# Patient Record
Sex: Male | Born: 1955
Health system: Southern US, Community
[De-identification: ages and names within clinical notes are randomized; demographics above are authoritative.]

## PROBLEM LIST (undated history)

## (undated) DIAGNOSIS — F419 Anxiety disorder, unspecified: Secondary | ICD-10-CM

## (undated) DIAGNOSIS — E785 Hyperlipidemia, unspecified: Secondary | ICD-10-CM

## (undated) DIAGNOSIS — K219 Gastro-esophageal reflux disease without esophagitis: Secondary | ICD-10-CM

## (undated) DIAGNOSIS — N210 Calculus in bladder: Secondary | ICD-10-CM

## (undated) HISTORY — DX: Hyperlipidemia, unspecified: E78.5

## (undated) HISTORY — DX: Anxiety disorder, unspecified: F41.9

## (undated) HISTORY — PX: PROSTATE SURGERY: SHX751

## (undated) HISTORY — DX: Gastro-esophageal reflux disease without esophagitis: K21.9

## (undated) HISTORY — PX: KNEE ARTHROSCOPY: SUR90

---

## 2005-09-21 ENCOUNTER — Ambulatory Visit: Payer: Self-pay | Admitting: Cardiology

## 2013-03-05 ENCOUNTER — Other Ambulatory Visit: Payer: Self-pay | Admitting: Family Medicine

## 2013-03-05 DIAGNOSIS — Z87891 Personal history of nicotine dependence: Secondary | ICD-10-CM

## 2013-03-31 ENCOUNTER — Other Ambulatory Visit: Payer: Self-pay

## 2015-02-22 MED FILL — PANTOPRAZOLE SOD DR 40 MG T: 40 | 90 days supply | Qty: 90 | Fill #0

## 2015-03-18 DIAGNOSIS — R972 Elevated prostate specific antigen [PSA]: Secondary | ICD-10-CM | POA: Diagnosis not present

## 2015-03-18 DIAGNOSIS — Z79899 Other long term (current) drug therapy: Secondary | ICD-10-CM | POA: Diagnosis not present

## 2015-03-18 DIAGNOSIS — E782 Mixed hyperlipidemia: Secondary | ICD-10-CM | POA: Diagnosis not present

## 2015-03-18 DIAGNOSIS — F338 Other recurrent depressive disorders: Secondary | ICD-10-CM | POA: Diagnosis not present

## 2015-03-18 DIAGNOSIS — R7301 Impaired fasting glucose: Secondary | ICD-10-CM | POA: Diagnosis not present

## 2015-03-18 DIAGNOSIS — K635 Polyp of colon: Secondary | ICD-10-CM | POA: Diagnosis not present

## 2015-03-18 DIAGNOSIS — K219 Gastro-esophageal reflux disease without esophagitis: Secondary | ICD-10-CM | POA: Diagnosis not present

## 2015-03-18 DIAGNOSIS — R0681 Apnea, not elsewhere classified: Secondary | ICD-10-CM | POA: Diagnosis not present

## 2015-04-01 MED FILL — ESCITALOPRAM 10 MG TABLET: 10 | 90 days supply | Qty: 90 | Fill #0

## 2015-05-06 DIAGNOSIS — R351 Nocturia: Secondary | ICD-10-CM | POA: Diagnosis not present

## 2015-05-06 DIAGNOSIS — Z0000 Encounter for general adult medical examination without abnormal findings: Secondary | ICD-10-CM | POA: Diagnosis not present

## 2015-05-06 DIAGNOSIS — R972 Elevated prostate specific antigen [PSA]: Secondary | ICD-10-CM | POA: Diagnosis not present

## 2015-05-06 DIAGNOSIS — N401 Enlarged prostate with lower urinary tract symptoms: Secondary | ICD-10-CM | POA: Diagnosis not present

## 2015-05-11 DIAGNOSIS — G4733 Obstructive sleep apnea (adult) (pediatric): Secondary | ICD-10-CM | POA: Diagnosis not present

## 2015-05-11 DIAGNOSIS — G2581 Restless legs syndrome: Secondary | ICD-10-CM | POA: Diagnosis not present

## 2015-05-30 ENCOUNTER — Ambulatory Visit (HOSPITAL_BASED_OUTPATIENT_CLINIC_OR_DEPARTMENT_OTHER): Payer: 59 | Attending: Internal Medicine | Admitting: Radiology

## 2015-05-30 VITALS — Ht 72.0 in | Wt 250.0 lb

## 2015-05-30 DIAGNOSIS — R0683 Snoring: Secondary | ICD-10-CM | POA: Insufficient documentation

## 2015-05-30 DIAGNOSIS — G4733 Obstructive sleep apnea (adult) (pediatric): Secondary | ICD-10-CM | POA: Insufficient documentation

## 2015-05-30 DIAGNOSIS — G4736 Sleep related hypoventilation in conditions classified elsewhere: Secondary | ICD-10-CM | POA: Diagnosis not present

## 2015-06-01 MED FILL — PANTOPRAZOLE SOD DR 40 MG T: 40 | 90 days supply | Qty: 90 | Fill #0

## 2015-06-05 DIAGNOSIS — G4733 Obstructive sleep apnea (adult) (pediatric): Secondary | ICD-10-CM | POA: Diagnosis not present

## 2015-06-05 DIAGNOSIS — R0683 Snoring: Secondary | ICD-10-CM | POA: Diagnosis not present

## 2015-06-05 NOTE — Progress Notes (Signed)
   Patient Name: George Love, George Love Date: 05/30/2015 Gender: Male D.O.B: March 15, 1955 Age (years): 59 Referring Provider: Carlena Sax Height (inches): 25 Interpreting Physician: Baird Lyons MD, ABSM Weight (lbs): 250 RPSGT: Jacolyn Reedy BMI: 34 MRN: [id number] Neck Size: 17.00 CLINICAL INFORMATION Sleep Study Type: Unattended Home Sleep Test   Indication for sleep study: Snoring, Witnessed Apneas   Epworth Sleepiness Score: 9 SLEEP STUDY TECHNIQUE A multi-channel overnight portable sleep study was performed. The channels recorded were: nasal airflow, thoracic respiratory movement, and oxygen saturation with a pulse oximetry. Snoring was also monitored.  MEDICATIONS Patient self administered medications include: none reported during study night.  SLEEP ARCHITECTURE Patient was studied for 292.2 minutes. The sleep efficiency was 81.5 % and the patient was supine for 32.5%. The arousal index was 0.2 per hour.  RESPIRATORY PARAMETERS The overall AHI was 118.5 per hour, with a central apnea index of 0.6 per hour. The oxygen nadir was 78% during sleep.  CARDIAC DATA Mean heart rate during sleep was 67.7 bpm.  IMPRESSIONS - Severe obstructive sleep apnea occurred during this study (AHI = 118.5/h). - No significant central sleep apnea occurred during this study (CAI = 0.6/h). - Severe oxygen desaturation was noted during this study (Min O2 = 78%). - Patient snored during 25 % of study  DIAGNOSIS - Obstructive Sleep Apnea (327.23 [G47.33 ICD-10]) - Nocturnal Hypoxemia (327.26 [G47.36 ICD-10])  RECOMMENDATIONS - Treatment with scores in this range usually begins with CPAP titration unless clinical circumstances suggest otherwise. - Avoid alcohol, sedatives and other CNS depressants that may worsen sleep apnea and disrupt normal sleep architecture. - Sleep hygiene should be reviewed to assess factors that may improve sleep quality. - Weight management and regular  exercise should be initiated or continued.  Deneise Lever Diplomate, American Board of Sleep Medicine  ELECTRONICALLY SIGNED ON:  06/05/2015, 7:28 PM [place] PH: [phone]   FX: [phone] [place]

## 2015-06-14 DIAGNOSIS — J029 Acute pharyngitis, unspecified: Secondary | ICD-10-CM | POA: Diagnosis not present

## 2015-06-27 DIAGNOSIS — G4733 Obstructive sleep apnea (adult) (pediatric): Secondary | ICD-10-CM | POA: Diagnosis not present

## 2015-06-29 MED FILL — ESCITALOPRAM 10 MG TABLET: 10 | 30 days supply | Qty: 30 | Fill #0

## 2015-07-28 DIAGNOSIS — G4733 Obstructive sleep apnea (adult) (pediatric): Secondary | ICD-10-CM | POA: Diagnosis not present

## 2015-08-17 MED FILL — PANTOPRAZOLE SOD DR 40 MG T: 40 | 90 days supply | Qty: 90 | Fill #0

## 2015-08-19 MED FILL — ESCITALOPRAM 10 MG TABLET: 10 | 90 days supply | Qty: 90 | Fill #0

## 2015-08-27 DIAGNOSIS — G4733 Obstructive sleep apnea (adult) (pediatric): Secondary | ICD-10-CM | POA: Diagnosis not present

## 2015-09-12 DIAGNOSIS — G4733 Obstructive sleep apnea (adult) (pediatric): Secondary | ICD-10-CM | POA: Diagnosis not present

## 2015-09-27 DIAGNOSIS — G4733 Obstructive sleep apnea (adult) (pediatric): Secondary | ICD-10-CM | POA: Diagnosis not present

## 2015-09-30 DIAGNOSIS — G4733 Obstructive sleep apnea (adult) (pediatric): Secondary | ICD-10-CM | POA: Diagnosis not present

## 2015-10-28 DIAGNOSIS — G4733 Obstructive sleep apnea (adult) (pediatric): Secondary | ICD-10-CM | POA: Diagnosis not present

## 2015-11-03 DIAGNOSIS — N401 Enlarged prostate with lower urinary tract symptoms: Secondary | ICD-10-CM | POA: Diagnosis not present

## 2015-11-27 DIAGNOSIS — G4733 Obstructive sleep apnea (adult) (pediatric): Secondary | ICD-10-CM | POA: Diagnosis not present

## 2015-11-30 MED FILL — PANTOPRAZOLE SOD DR 40 MG T: 40 | 90 days supply | Qty: 90 | Fill #1

## 2015-11-30 MED FILL — ESCITALOPRAM 10 MG TABLET: 10 | 90 days supply | Qty: 90 | Fill #1

## 2015-12-28 DIAGNOSIS — G4733 Obstructive sleep apnea (adult) (pediatric): Secondary | ICD-10-CM | POA: Diagnosis not present

## 2016-01-11 DIAGNOSIS — H52223 Regular astigmatism, bilateral: Secondary | ICD-10-CM | POA: Diagnosis not present

## 2016-01-11 DIAGNOSIS — H524 Presbyopia: Secondary | ICD-10-CM | POA: Diagnosis not present

## 2016-01-27 DIAGNOSIS — G4733 Obstructive sleep apnea (adult) (pediatric): Secondary | ICD-10-CM | POA: Diagnosis not present

## 2016-02-16 MED FILL — PANTOPRAZOLE SOD DR 40 MG T: 40 | 90 days supply | Qty: 90 | Fill #1

## 2016-02-16 MED FILL — ESCITALOPRAM 10 MG TABLET: 10 | 90 days supply | Qty: 90 | Fill #0

## 2016-04-19 DIAGNOSIS — L570 Actinic keratosis: Secondary | ICD-10-CM | POA: Diagnosis not present

## 2016-04-19 DIAGNOSIS — Z0000 Encounter for general adult medical examination without abnormal findings: Secondary | ICD-10-CM | POA: Diagnosis not present

## 2016-04-19 DIAGNOSIS — E782 Mixed hyperlipidemia: Secondary | ICD-10-CM | POA: Diagnosis not present

## 2016-04-19 DIAGNOSIS — R972 Elevated prostate specific antigen [PSA]: Secondary | ICD-10-CM | POA: Diagnosis not present

## 2016-04-19 DIAGNOSIS — K219 Gastro-esophageal reflux disease without esophagitis: Secondary | ICD-10-CM | POA: Diagnosis not present

## 2016-04-19 DIAGNOSIS — R7301 Impaired fasting glucose: Secondary | ICD-10-CM | POA: Diagnosis not present

## 2016-04-19 DIAGNOSIS — Z86010 Personal history of colonic polyps: Secondary | ICD-10-CM | POA: Diagnosis not present

## 2016-04-19 DIAGNOSIS — F338 Other recurrent depressive disorders: Secondary | ICD-10-CM | POA: Diagnosis not present

## 2016-05-21 DIAGNOSIS — J069 Acute upper respiratory infection, unspecified: Secondary | ICD-10-CM | POA: Diagnosis not present

## 2016-05-21 DIAGNOSIS — H10023 Other mucopurulent conjunctivitis, bilateral: Secondary | ICD-10-CM | POA: Diagnosis not present

## 2016-06-05 MED FILL — PANTOPRAZOLE SOD DR 40 MG T: 40 | 90 days supply | Qty: 90 | Fill #0

## 2016-06-05 MED FILL — ESCITALOPRAM 10 MG TABLET: 10 | 90 days supply | Qty: 90 | Fill #0

## 2016-08-20 MED FILL — ESCITALOPRAM 10 MG TABLET: 10 | 90 days supply | Qty: 90 | Fill #0

## 2016-08-20 MED FILL — PANTOPRAZOLE SOD DR 40 MG T: 40 | 90 days supply | Qty: 90 | Fill #0

## 2016-09-12 DIAGNOSIS — G4733 Obstructive sleep apnea (adult) (pediatric): Secondary | ICD-10-CM | POA: Diagnosis not present

## 2016-10-03 DIAGNOSIS — G4733 Obstructive sleep apnea (adult) (pediatric): Secondary | ICD-10-CM | POA: Diagnosis not present

## 2016-12-10 MED FILL — ESCITALOPRAM 10 MG TABLET: 10 | 90 days supply | Qty: 90 | Fill #0

## 2016-12-10 MED FILL — PANTOPRAZOLE SOD DR 40 MG T: 40 | 90 days supply | Qty: 90 | Fill #0

## 2017-01-16 DIAGNOSIS — H524 Presbyopia: Secondary | ICD-10-CM | POA: Diagnosis not present

## 2017-01-16 DIAGNOSIS — H52223 Regular astigmatism, bilateral: Secondary | ICD-10-CM | POA: Diagnosis not present

## 2017-02-14 DIAGNOSIS — R972 Elevated prostate specific antigen [PSA]: Secondary | ICD-10-CM | POA: Diagnosis not present

## 2017-02-14 DIAGNOSIS — R351 Nocturia: Secondary | ICD-10-CM | POA: Diagnosis not present

## 2017-03-04 MED FILL — ESCITALOPRAM 10 MG TABLET: 10 | 90 days supply | Qty: 90 | Fill #1

## 2017-03-04 MED FILL — PANTOPRAZOLE SOD DR 40 MG T: 40 | 90 days supply | Qty: 90 | Fill #1

## 2017-04-08 ENCOUNTER — Other Ambulatory Visit: Payer: Self-pay | Admitting: Urology

## 2017-04-08 DIAGNOSIS — R972 Elevated prostate specific antigen [PSA]: Secondary | ICD-10-CM

## 2017-04-21 LAB — HM COLONOSCOPY

## 2017-05-03 ENCOUNTER — Ambulatory Visit
Admission: RE | Admit: 2017-05-03 | Discharge: 2017-05-03 | Disposition: A | Discharge status: Home / Self Care | Payer: No Typology Code available for payment source | Source: Ambulatory Visit | Attending: Urology | Admitting: Urology

## 2017-05-03 DIAGNOSIS — R972 Elevated prostate specific antigen [PSA]: Secondary | ICD-10-CM

## 2017-05-03 MED ORDER — GADOBENATE DIMEGLUMINE 529 MG/ML IV SOLN
20.0000 mL | Freq: Once | INTRAVENOUS | Status: AC | PRN
  Administered 2017-05-03: 20 mL via INTRAVENOUS

## 2017-05-14 MED FILL — PEG-3350 SOLUTION: 420 | 1 days supply | Qty: 4000 | Fill #0

## 2017-06-14 MED FILL — ESCITALOPRAM 10 MG TABLET: 10 | 90 days supply | Qty: 90 | Fill #0

## 2017-06-14 MED FILL — PANTOPRAZOLE SOD DR 40 MG T: 40 | 90 days supply | Qty: 90 | Fill #0

## 2017-09-20 MED FILL — PANTOPRAZOLE SOD DR 40 MG T: 40 | 90 days supply | Qty: 90 | Fill #1

## 2017-09-20 MED FILL — ESCITALOPRAM 10 MG TABLET: 10 | 90 days supply | Qty: 90 | Fill #1

## 2017-10-18 MED FILL — levoFLOXacin 750 MG TABS: 750 | 1 days supply | Qty: 1 | Fill #0

## 2017-12-19 MED FILL — ESCITALOPRAM 10 MG TABLET: 10 | 90 days supply | Qty: 90 | Fill #2

## 2017-12-19 MED FILL — PANTOPRAZOLE SOD DR 40 MG T: 40 | 90 days supply | Qty: 90 | Fill #2

## 2018-02-20 MED FILL — AZITHROMYCIN 250 MG TABLET: 250 | 3 days supply | Qty: 6 | Fill #0

## 2018-02-20 MED FILL — ATOVAQUONE-PROGUANIL 250-10: 250-100 | 21 days supply | Qty: 21 | Fill #0

## 2018-03-04 DIAGNOSIS — G4733 Obstructive sleep apnea (adult) (pediatric): Secondary | ICD-10-CM | POA: Diagnosis not present

## 2018-03-26 DIAGNOSIS — H524 Presbyopia: Secondary | ICD-10-CM | POA: Diagnosis not present

## 2018-03-26 DIAGNOSIS — H52223 Regular astigmatism, bilateral: Secondary | ICD-10-CM | POA: Diagnosis not present

## 2018-03-31 MED FILL — ESCITALOPRAM 10 MG TABLET: 10 | 90 days supply | Qty: 90 | Fill #3

## 2018-03-31 MED FILL — PANTOPRAZOLE SOD DR 40 MG T: 40 | 90 days supply | Qty: 90 | Fill #3

## 2018-04-05 DIAGNOSIS — R509 Fever, unspecified: Secondary | ICD-10-CM | POA: Diagnosis not present

## 2018-04-05 DIAGNOSIS — J101 Influenza due to other identified influenza virus with other respiratory manifestations: Secondary | ICD-10-CM | POA: Diagnosis not present

## 2018-04-29 DIAGNOSIS — L570 Actinic keratosis: Secondary | ICD-10-CM | POA: Diagnosis not present

## 2018-04-29 MED FILL — FLUOROURACIL 5% CREAM: 5 | 20 days supply | Qty: 40 | Fill #0

## 2018-05-21 DIAGNOSIS — R972 Elevated prostate specific antigen [PSA]: Secondary | ICD-10-CM | POA: Diagnosis not present

## 2018-05-29 DIAGNOSIS — R972 Elevated prostate specific antigen [PSA]: Secondary | ICD-10-CM | POA: Diagnosis not present

## 2018-05-29 DIAGNOSIS — R351 Nocturia: Secondary | ICD-10-CM | POA: Diagnosis not present

## 2018-07-01 MED FILL — PANTOPRAZOLE SOD DR 40 MG T: 40 | 90 days supply | Qty: 90 | Fill #0

## 2018-07-01 MED FILL — ESCITALOPRAM 10 MG TABLET: 10 | 90 days supply | Qty: 90 | Fill #0

## 2018-10-08 MED FILL — PANTOPRAZOLE SOD DR 40 MG T: 40 | 90 days supply | Qty: 90 | Fill #0

## 2018-10-08 MED FILL — ESCITALOPRAM 10 MG TABLET: 10 | 90 days supply | Qty: 90 | Fill #0

## 2018-11-19 DIAGNOSIS — R972 Elevated prostate specific antigen [PSA]: Secondary | ICD-10-CM | POA: Diagnosis not present

## 2019-01-09 MED FILL — PANTOPRAZOLE SOD DR 40 MG T: 40 | 30 days supply | Qty: 30 | Fill #0

## 2019-01-09 MED FILL — ESCITALOPRAM 10 MG TABLET: 10 | 30 days supply | Qty: 30 | Fill #0

## 2019-01-26 ENCOUNTER — Other Ambulatory Visit: Payer: Self-pay

## 2019-01-26 DIAGNOSIS — Z20822 Contact with and (suspected) exposure to covid-19: Secondary | ICD-10-CM

## 2019-01-27 LAB — NOVEL CORONAVIRUS, NAA: SARS-CoV-2, NAA: NOT DETECTED

## 2019-02-11 DIAGNOSIS — E782 Mixed hyperlipidemia: Secondary | ICD-10-CM | POA: Diagnosis not present

## 2019-02-11 DIAGNOSIS — G4733 Obstructive sleep apnea (adult) (pediatric): Secondary | ICD-10-CM | POA: Diagnosis not present

## 2019-02-11 DIAGNOSIS — R7301 Impaired fasting glucose: Secondary | ICD-10-CM | POA: Diagnosis not present

## 2019-02-11 DIAGNOSIS — Z86010 Personal history of colonic polyps: Secondary | ICD-10-CM | POA: Diagnosis not present

## 2019-02-11 DIAGNOSIS — R972 Elevated prostate specific antigen [PSA]: Secondary | ICD-10-CM | POA: Diagnosis not present

## 2019-02-11 DIAGNOSIS — F338 Other recurrent depressive disorders: Secondary | ICD-10-CM | POA: Diagnosis not present

## 2019-02-11 DIAGNOSIS — Z79899 Other long term (current) drug therapy: Secondary | ICD-10-CM | POA: Diagnosis not present

## 2019-02-11 DIAGNOSIS — K219 Gastro-esophageal reflux disease without esophagitis: Secondary | ICD-10-CM | POA: Diagnosis not present

## 2019-02-11 MED FILL — ESCITALOPRAM 10 MG TABLET: 10 | 90 days supply | Qty: 90 | Fill #0

## 2019-02-11 MED FILL — PANTOPRAZOLE SOD DR 40 MG T: 40 | 90 days supply | Qty: 90 | Fill #0

## 2019-02-19 DIAGNOSIS — R7301 Impaired fasting glucose: Secondary | ICD-10-CM | POA: Diagnosis not present

## 2019-02-19 DIAGNOSIS — F338 Other recurrent depressive disorders: Secondary | ICD-10-CM | POA: Diagnosis not present

## 2019-02-19 DIAGNOSIS — Z79899 Other long term (current) drug therapy: Secondary | ICD-10-CM | POA: Diagnosis not present

## 2019-02-19 DIAGNOSIS — E782 Mixed hyperlipidemia: Secondary | ICD-10-CM | POA: Diagnosis not present

## 2019-03-24 ENCOUNTER — Other Ambulatory Visit: Payer: No Typology Code available for payment source

## 2019-03-25 ENCOUNTER — Ambulatory Visit: Payer: No Typology Code available for payment source | Attending: Internal Medicine

## 2019-03-25 DIAGNOSIS — Z20822 Contact with and (suspected) exposure to covid-19: Secondary | ICD-10-CM

## 2019-03-25 MED FILL — ESCITALOPRAM 10 MG TABLET: 10 | 90 days supply | Qty: 90 | Fill #0

## 2019-03-25 MED FILL — PANTOPRAZOLE SOD DR 40 MG T: 40 | 90 days supply | Qty: 90 | Fill #0

## 2019-03-26 LAB — NOVEL CORONAVIRUS, NAA: SARS-CoV-2, NAA: NOT DETECTED

## 2019-04-29 IMAGING — MR MR PROSTATE WO/W CM
56 series · 56 of 56 positions shown · IV contrast (Multihance 20cc)
Comparison: None.

ADDENDUM:
The original report was by Dr. Codi Maltez. The following
addendum is by Dr. Codi Maltez:

I have been informed by [HOSPITAL] [REDACTED] personnel that the targeting
data has fallen off of the server because the exam is so old, but
now there is a clinical request to have targeting data for a
prostate biopsy. I have been asked to reprocessed this case using
the DynaCAD software.
Given the interval 6 months since this scan was done, I can make no
assurance that the lesions are in the same place or that the
prostate has the same size and configuration as it did back in
[REDACTED]. Targeting data may well be off and can lead to mistargeting.
Nevertheless I do understand that this is a clinical request and so
I will reprocessed the data for targeting.
Targeting data has been sent to the DynaCAD for region of interest #
1 in the right peripheral zone. I was a little bit more generous
with extending this lesion up towards the base of the prostate
gland, resulting in an total volume of 1.4 cubic cm.
Targeting data has been sent to [employer] for region of interest # 2
in the left peripheral zone.
CLINICAL DATA: Elevated PSA level with polyuria. PSA level on
02/14/2017 of 8.4. Prior biopsy results from 10/22/2013 reveal
Gleason 3+3=6 prostate adenocarcinoma in small portions of cores
from the right lateral mid gland, right mid gland, right lateral
apex, and right apex.
EXAM:
MR PROSTATE WITHOUT AND WITH CONTRAST
TECHNIQUE: Multiplanar multisequence MRI images were obtained of the pelvis
centered about the prostate. Pre and post contrast images were
obtained.
CONTRAST:  20 cc MultiHance
Creatinine was obtained on site at [HOSPITAL] at [HOSPITAL].
Results: Creatinine 0.8 mg/dL.

[Series 3: T1 · axial · 8.0mm · 1.06mm/px · 1 of 32 slices shown (1 of 2)]
[im 1/32]
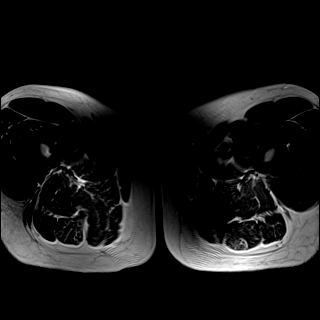

[Series 4: bSSFP fat-sat · axial · 8.0mm · 0.74mm/px · 1 of 32 slices shown]
[im 1/32]
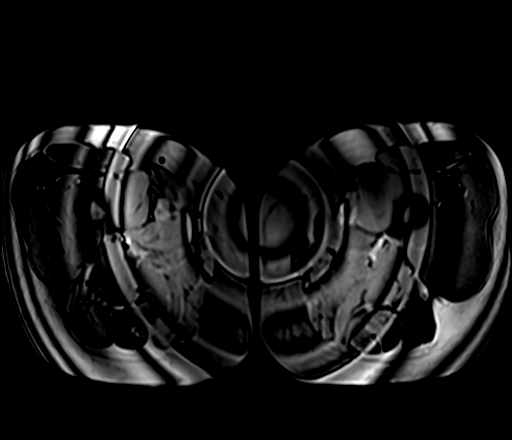

[Series 5: T2 · sagittal · 3.5mm · 0.59mm/px · 1 of 44 slices shown (1 of 4)]
[im 1/44]
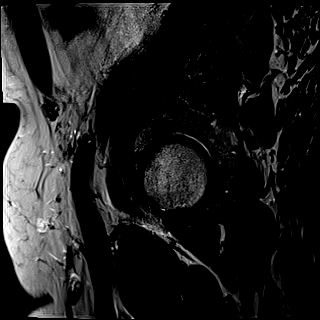

[Series 6: T1 · axial · 3.0mm · 0.33mm/px · 1 of 48 slices shown (2 of 2)]
[im 1/48]
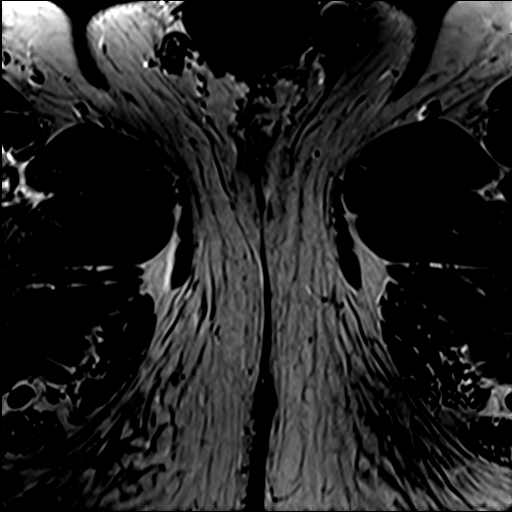

[Series 7: T2 · axial · 3.5mm · 0.56mm/px · 1 of 35 slices shown (2 of 4)]
[im 1/35]
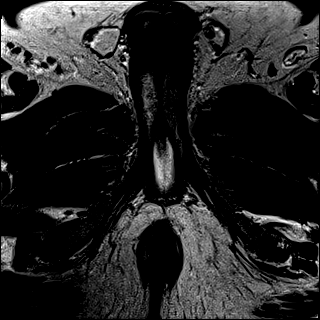

[Series 8: T2 · axial · 1.0mm · 1.04mm/px · 1 of 104 slices shown (3 of 4)]
[im 1/104]
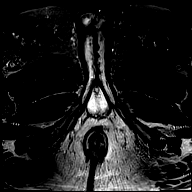

[Series 9: T2 · coronal · 3.5mm · 0.59mm/px · 1 of 43 slices shown (4 of 4)]
[im 1/43]
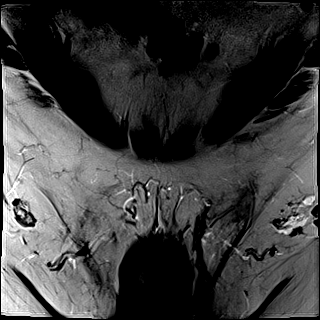

[Series 10: DWI · axial · 3.5mm · 1.56mm/px · 1 of 90 slices shown (1 of 2)]
[im 1/90]
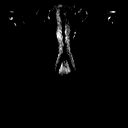

[Series 11: DWI · axial · 3.5mm · 1.56mm/px · 1 of 29 slices shown (2 of 2)]
[im 1/29]
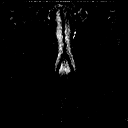

[Series 12: pre t1_twist_tra_dyn_ttc=7.5s · axial · non-contrast · 3.5mm · 0.89mm/px · 1 of 32 slices shown]
[im 1/32]
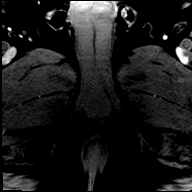

[Series 13: post t1_twist_tra_dyn-copy center · axial · 3.5mm · 0.89mm/px · 1 of 32 slices shown (1 of 24)]
[im 1/32]
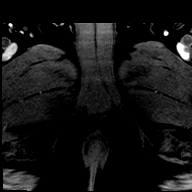

[Series 14: post t1_twist_tra_dyn-copy center · axial · 3.5mm · 0.89mm/px · 1 of 32 slices shown (2 of 24)]
[im 1/32]
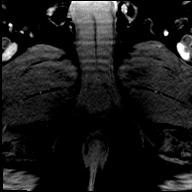

[Series 15: post t1_twist_tra_dyn-copy cent_sub_ttc=33.8s · axial · 3.5mm · 0.89mm/px · 1 of 32 slices shown]
[im 1/32]
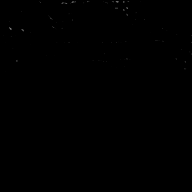

[Series 16: post t1_twist_tra_dyn-copy center · axial · 3.5mm · 0.89mm/px · 1 of 32 slices shown (3 of 24)]
[im 1/32]
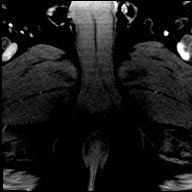

[Series 17: post t1_twist_tra_dyn-copy cent_sub_ttc=49.2s · axial · 3.5mm · 0.89mm/px · 1 of 32 slices shown]
[im 1/32]
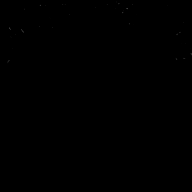

[Series 18: post t1_twist_tra_dyn-copy center · axial · 3.5mm · 0.89mm/px · 1 of 32 slices shown (4 of 24)]
[im 1/32]
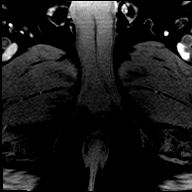

[Series 19: post t1_twist_tra_dyn-copy cent_sub_ttc=64.6s · axial · 3.5mm · 0.89mm/px · 1 of 32 slices shown]
[im 1/32]
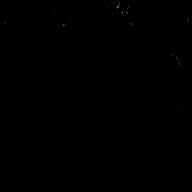

[Series 20: post t1_twist_tra_dyn-copy center · axial · 3.5mm · 0.89mm/px · 1 of 32 slices shown (5 of 24)]
[im 1/32]
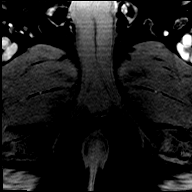

[Series 21: post t1_twist_tra_dyn-copy cent_sub_ttc=80.0s · axial · 3.5mm · 0.89mm/px · 1 of 32 slices shown]
[im 1/32]
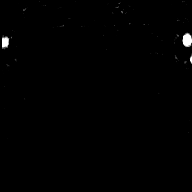

[Series 22: post t1_twist_tra_dyn-copy center · axial · 3.5mm · 0.89mm/px · 1 of 32 slices shown (6 of 24)]
[im 1/32]
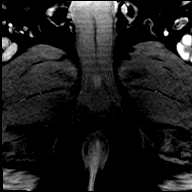

[Series 23: post t1_twist_tra_dyn-copy cent_sub_ttc=95.4s · axial · 3.5mm · 0.89mm/px · 1 of 32 slices shown]
[im 1/32]
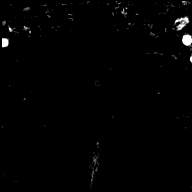

[Series 24: post t1_twist_tra_dyn-copy center · axial · 3.5mm · 0.89mm/px · 1 of 32 slices shown (7 of 24)]
[im 1/32]
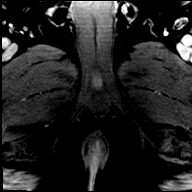

[Series 25: post t1_twist_tra_dyn-copy cent_sub_ttc=110.8s · axial · 3.5mm · 0.89mm/px · 1 of 32 slices shown]
[im 1/32]
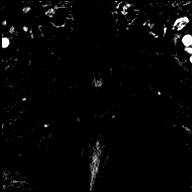

[Series 26: post t1_twist_tra_dyn-copy center · axial · 3.5mm · 0.89mm/px · 1 of 32 slices shown (8 of 24)]
[im 1/32]
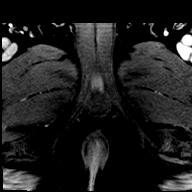

[Series 27: post t1_twist_tra_dyn-copy cent_sub_ttc=126.3s · axial · 3.5mm · 0.89mm/px · 1 of 32 slices shown]
[im 1/32]
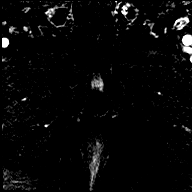

[Series 28: post t1_twist_tra_dyn-copy center · axial · 3.5mm · 0.89mm/px · 1 of 32 slices shown (9 of 24)]
[im 1/32]
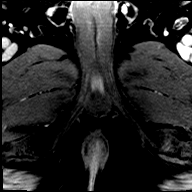

[Series 29: post t1_twist_tra_dyn-copy cent_sub_ttc=141.7s · axial · 3.5mm · 0.89mm/px · 1 of 32 slices shown]
[im 1/32]
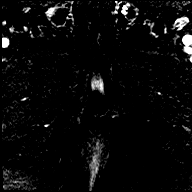

[Series 30: post t1_twist_tra_dyn-copy center · axial · 3.5mm · 0.89mm/px · 1 of 32 slices shown (10 of 24)]
[im 1/32]
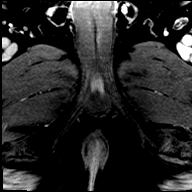

[Series 31: post t1_twist_tra_dyn-copy cent_sub_ttc=157.1s · axial · 3.5mm · 0.89mm/px · 1 of 32 slices shown]
[im 1/32]
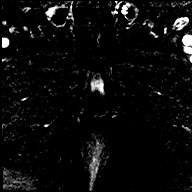

[Series 32: post t1_twist_tra_dyn-copy center · axial · 3.5mm · 0.89mm/px · 1 of 32 slices shown (11 of 24)]
[im 1/32]
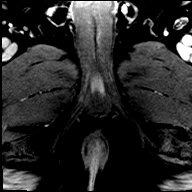

[Series 33: post t1_twist_tra_dyn-copy cent_sub_ttc=172.5s · axial · 3.5mm · 0.89mm/px · 1 of 32 slices shown]
[im 1/32]
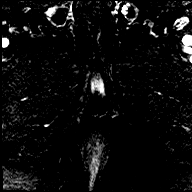

[Series 34: post t1_twist_tra_dyn-copy center · axial · 3.5mm · 0.89mm/px · 1 of 32 slices shown (12 of 24)]
[im 1/32]
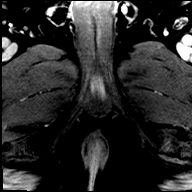

[Series 35: post t1_twist_tra_dyn-copy cent_sub_ttc=187.9s · axial · 3.5mm · 0.89mm/px · 1 of 32 slices shown]
[im 1/32]
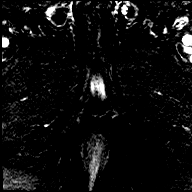

[Series 36: post t1_twist_tra_dyn-copy center · axial · 3.5mm · 0.89mm/px · 1 of 32 slices shown (13 of 24)]
[im 1/32]
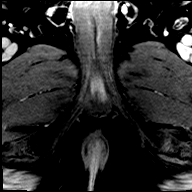

[Series 37: post t1_twist_tra_dyn-copy cent_sub_ttc=203.3s · axial · 3.5mm · 0.89mm/px · 1 of 32 slices shown]
[im 1/32]
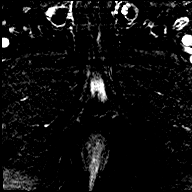

[Series 38: post t1_twist_tra_dyn-copy center · axial · 3.5mm · 0.89mm/px · 1 of 32 slices shown (14 of 24)]
[im 1/32]
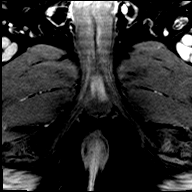

[Series 39: post t1_twist_tra_dyn-copy cent_sub_ttc=218.7s · axial · 3.5mm · 0.89mm/px · 1 of 32 slices shown]
[im 1/32]
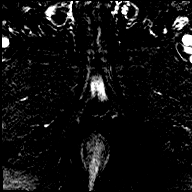

[Series 40: post t1_twist_tra_dyn-copy center · axial · 3.5mm · 0.89mm/px · 1 of 32 slices shown (15 of 24)]
[im 1/32]
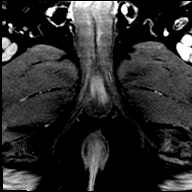

[Series 41: post t1_twist_tra_dyn-copy cent_sub_ttc=234.2s · axial · 3.5mm · 0.89mm/px · 1 of 32 slices shown]
[im 1/32]
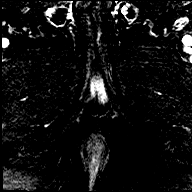

[Series 42: post t1_twist_tra_dyn-copy center · axial · 3.5mm · 0.89mm/px · 1 of 32 slices shown (16 of 24)]
[im 1/32]
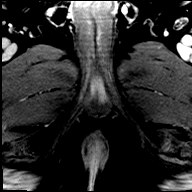

[Series 43: post t1_twist_tra_dyn-copy cent_sub_ttc=249.6s · axial · 3.5mm · 0.89mm/px · 1 of 32 slices shown]
[im 1/32]
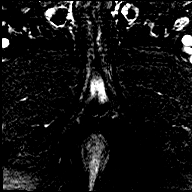

[Series 44: post t1_twist_tra_dyn-copy center · axial · 3.5mm · 0.89mm/px · 1 of 32 slices shown (17 of 24)]
[im 1/32]
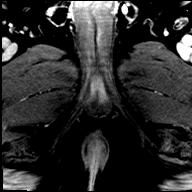

[Series 45: post t1_twist_tra_dyn-copy cent_sub_ttc=265.0s · axial · 3.5mm · 0.89mm/px · 1 of 32 slices shown]
[im 1/32]
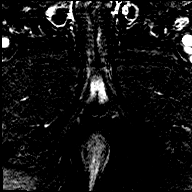

[Series 46: post t1_twist_tra_dyn-copy center · axial · 3.5mm · 0.89mm/px · 1 of 32 slices shown (18 of 24)]
[im 1/32]
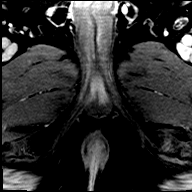

[Series 47: post t1_twist_tra_dyn-copy cent_sub_ttc=280.4s · axial · 3.5mm · 0.89mm/px · 1 of 32 slices shown]
[im 1/32]
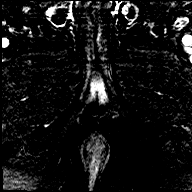

[Series 48: post t1_twist_tra_dyn-copy center · axial · 3.5mm · 0.89mm/px · 1 of 32 slices shown (19 of 24)]
[im 1/32]
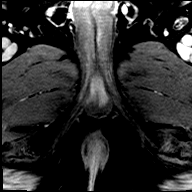

[Series 49: post t1_twist_tra_dyn-copy cent_sub_ttc=295.8s · axial · 3.5mm · 0.89mm/px · 1 of 32 slices shown]
[im 1/32]
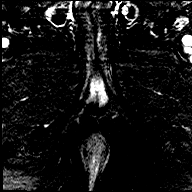

[Series 50: post t1_twist_tra_dyn-copy center · axial · 3.5mm · 0.89mm/px · 1 of 32 slices shown (20 of 24)]
[im 1/32]
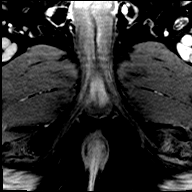

[Series 51: post t1_twist_tra_dyn-copy cent_sub_ttc=311.2s · axial · 3.5mm · 0.89mm/px · 1 of 32 slices shown]
[im 1/32]
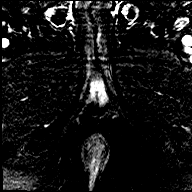

[Series 52: post t1_twist_tra_dyn-copy center · axial · 3.5mm · 0.89mm/px · 1 of 32 slices shown (21 of 24)]
[im 1/32]
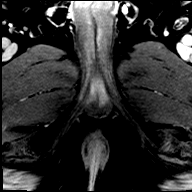

[Series 53: post t1_twist_tra_dyn-copy cent_sub_ttc=326.6s · axial · 3.5mm · 0.89mm/px · 1 of 32 slices shown]
[im 1/32]
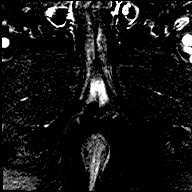

[Series 54: post t1_twist_tra_dyn-copy center · axial · 3.5mm · 0.89mm/px · 1 of 32 slices shown (22 of 24)]
[im 1/32]
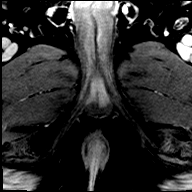

[Series 55: post t1_twist_tra_dyn-copy cent_sub_ttc=342.0s · axial · 3.5mm · 0.89mm/px · 1 of 32 slices shown]
[im 1/32]
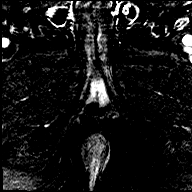

[Series 56: post t1_twist_tra_dyn-copy center · axial · 3.5mm · 0.89mm/px · 1 of 32 slices shown (23 of 24)]
[im 1/32]
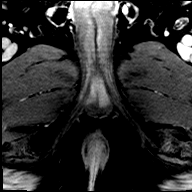

[Series 57: post t1_twist_tra_dyn-copy cent_sub_ttc=357.5s · axial · 3.5mm · 0.89mm/px · 1 of 32 slices shown]
[im 1/32]
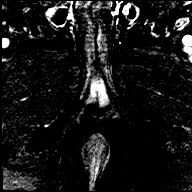

[Series 58: post t1_twist_tra_dyn-copy center · axial · 3.5mm · 0.89mm/px · 1 of 32 slices shown (24 of 24)]
[im 1/32]
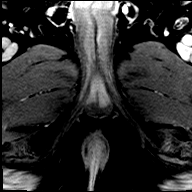

[56 of 56 positions shown; findings below may reference images not displayed]

FINDINGS: Prostate: Region of interest # 1 is a PI-RADS category 4 lesion
involving the posteromedial and posterolateral mid gland peripheral
zone and posteromedial peripheral zone apex on the right side,
demonstrating early contrast enhancement. This lesion measures
cubic cm (1.6 by 0.7 by 1.1 cm).

Region of interest # 2 is a PI-RADS category 4 bandlike T2 and ADC
map signal hypointensity tracking in the left posterolateral and
posteromedial mid gland into the left posterior prostate base. This
lesion likewise demonstrates early enhancement, although the
bandlike configuration might suggests a potential postinflammatory
etiology. This lesion measures 0.50 cubic cm (1.7 by 0.3 by 1.0 cm).

Appropriate targeting data sent to [employer].

Note is made that the right peripheral zone of the prostate gland is
somewhat smaller than the left, probably due to chronic or remote
inflammation. Prostate hypertrophy noted.

Volume: 3D volumetric analysis: 56.56 cubic cm (5.8 by 5.0 by
cm)

Transcapsular spread:  Absent

Seminal vesicle involvement: Absent

Neurovascular bundle involvement: Absent

Pelvic adenopathy: Absent

Bone metastasis: Absent

Other findings: Small bilateral indirect inguinal hernias containing
adipose tissue.
IMPRESSION: 1. Two PI-RADS category 4 lesions are identified, 1 in the right mid
gland and apex, and the other in the left base and mid gland.
Appropriate targeting data sent to [employer].
2. The right peripheral zone is somewhat smaller than the left,
probably related to chronic or remote inflammation.

## 2019-05-28 ENCOUNTER — Ambulatory Visit: Payer: No Typology Code available for payment source | Attending: Internal Medicine

## 2019-05-28 DIAGNOSIS — Z23 Encounter for immunization: Secondary | ICD-10-CM

## 2019-05-28 NOTE — Progress Notes (Signed)
   Covid-19 Vaccination Clinic  Name:  ADGER OSLUND    MRN: [id number] DOB: 1955-12-27  05/28/2019  Mr. Baratta was observed post Covid-19 immunization for 15 minutes without incident. He was provided with Vaccine Information Sheet and instruction to access the V-Safe system.   Mr. Salber was instructed to call 911 with any severe reactions post vaccine: Marland Kitchen Difficulty breathing  . Swelling of face and throat  . A fast heartbeat  . A bad rash all over body  . Dizziness and weakness   Immunizations Administered    Name Date Dose VIS Date Route   Pfizer COVID-19 Vaccine 05/28/2019  8:16 AM 0.3 mL 01/30/2019 Intramuscular   Manufacturer: [place]   Lot: [id number]   [place]: [id number]

## 2019-06-03 DIAGNOSIS — H524 Presbyopia: Secondary | ICD-10-CM | POA: Diagnosis not present

## 2019-06-03 DIAGNOSIS — H52223 Regular astigmatism, bilateral: Secondary | ICD-10-CM | POA: Diagnosis not present

## 2019-06-18 DIAGNOSIS — Z0000 Encounter for general adult medical examination without abnormal findings: Secondary | ICD-10-CM | POA: Diagnosis not present

## 2019-06-18 DIAGNOSIS — R972 Elevated prostate specific antigen [PSA]: Secondary | ICD-10-CM | POA: Diagnosis not present

## 2019-06-18 DIAGNOSIS — G4733 Obstructive sleep apnea (adult) (pediatric): Secondary | ICD-10-CM | POA: Diagnosis not present

## 2019-06-18 DIAGNOSIS — F338 Other recurrent depressive disorders: Secondary | ICD-10-CM | POA: Diagnosis not present

## 2019-06-18 DIAGNOSIS — E782 Mixed hyperlipidemia: Secondary | ICD-10-CM | POA: Diagnosis not present

## 2019-06-18 DIAGNOSIS — Z86010 Personal history of colonic polyps: Secondary | ICD-10-CM | POA: Diagnosis not present

## 2019-06-18 DIAGNOSIS — K219 Gastro-esophageal reflux disease without esophagitis: Secondary | ICD-10-CM | POA: Diagnosis not present

## 2019-06-18 DIAGNOSIS — R7301 Impaired fasting glucose: Secondary | ICD-10-CM | POA: Diagnosis not present

## 2019-06-18 DIAGNOSIS — R030 Elevated blood-pressure reading, without diagnosis of hypertension: Secondary | ICD-10-CM | POA: Diagnosis not present

## 2019-06-22 ENCOUNTER — Ambulatory Visit: Payer: No Typology Code available for payment source | Attending: Internal Medicine

## 2019-06-22 DIAGNOSIS — Z23 Encounter for immunization: Secondary | ICD-10-CM

## 2019-06-22 NOTE — Progress Notes (Signed)
   Covid-19 Vaccination Clinic  Name:  George Love    MRN: [id number] DOB: 1955-08-18  06/22/2019  [name] was observed post Covid-19 immunization for 15 minutes without incident. He was provided with Vaccine Information Sheet and instruction to access the V-Safe system.   [name] was instructed to call 911 with any severe reactions post vaccine: Marland Kitchen Difficulty breathing  . Swelling of face and throat  . A fast heartbeat  . A bad rash all over body  . Dizziness and weakness   Immunizations Administered    Name Date Dose VIS Date Route   Pfizer COVID-19 Vaccine 06/22/2019  8:14 AM 0.3 mL 04/15/2018 Intramuscular   Manufacturer: [place]   Lot: [id number]   [place]: [id number]

## 2019-08-13 MED FILL — PANTOPRAZOLE SOD DR 40 MG T: 40 | 90 days supply | Qty: 90 | Fill #0

## 2019-08-13 MED FILL — ESCITALOPRAM 10 MG TABLET: 10 | 90 days supply | Qty: 90 | Fill #1

## 2019-08-17 DIAGNOSIS — Z1152 Encounter for screening for COVID-19: Secondary | ICD-10-CM | POA: Diagnosis not present

## 2019-08-17 DIAGNOSIS — Z03818 Encounter for observation for suspected exposure to other biological agents ruled out: Secondary | ICD-10-CM | POA: Diagnosis not present

## 2019-08-20 DIAGNOSIS — L738 Other specified follicular disorders: Secondary | ICD-10-CM | POA: Diagnosis not present

## 2019-08-20 DIAGNOSIS — L720 Epidermal cyst: Secondary | ICD-10-CM | POA: Diagnosis not present

## 2019-08-20 DIAGNOSIS — L821 Other seborrheic keratosis: Secondary | ICD-10-CM | POA: Diagnosis not present

## 2019-08-20 DIAGNOSIS — L570 Actinic keratosis: Secondary | ICD-10-CM | POA: Diagnosis not present

## 2019-08-24 DIAGNOSIS — Z03818 Encounter for observation for suspected exposure to other biological agents ruled out: Secondary | ICD-10-CM | POA: Diagnosis not present

## 2019-08-24 DIAGNOSIS — Z1152 Encounter for screening for COVID-19: Secondary | ICD-10-CM | POA: Diagnosis not present

## 2019-09-17 ENCOUNTER — Other Ambulatory Visit (HOSPITAL_BASED_OUTPATIENT_CLINIC_OR_DEPARTMENT_OTHER): Payer: Self-pay | Admitting: Family Medicine

## 2019-09-24 ENCOUNTER — Ambulatory Visit: Payer: No Typology Code available for payment source | Admitting: Urology

## 2019-09-25 MED FILL — PANTOPRAZOLE SOD DR 40 MG T: 40 | 90 days supply | Qty: 90 | Fill #0

## 2019-09-25 MED FILL — ESCITALOPRAM 10 MG TABLET: 10 | 90 days supply | Qty: 90 | Fill #0

## 2019-10-21 ENCOUNTER — Ambulatory Visit: Payer: No Typology Code available for payment source | Admitting: Urology

## 2019-10-28 ENCOUNTER — Encounter: Payer: Self-pay | Admitting: Urology

## 2019-10-28 ENCOUNTER — Ambulatory Visit (INDEPENDENT_AMBULATORY_CARE_PROVIDER_SITE_OTHER): Payer: 59 | Admitting: Urology

## 2019-10-28 ENCOUNTER — Other Ambulatory Visit: Payer: Self-pay

## 2019-10-28 VITALS — BP 142/83 | HR 58 | Temp 97.8°F | Ht 72.0 in | Wt 240.0 lb

## 2019-10-28 DIAGNOSIS — R972 Elevated prostate specific antigen [PSA]: Secondary | ICD-10-CM | POA: Diagnosis not present

## 2019-10-28 DIAGNOSIS — R351 Nocturia: Secondary | ICD-10-CM

## 2019-10-28 LAB — URINALYSIS, ROUTINE W REFLEX MICROSCOPIC
Bilirubin, UA: NEGATIVE
Glucose, UA: NEGATIVE
Ketones, UA: NEGATIVE
Leukocytes,UA: NEGATIVE
Nitrite, UA: NEGATIVE
Protein,UA: NEGATIVE
RBC, UA: NEGATIVE
Specific Gravity, UA: 1.025 (ref 1.005–1.030)
Urobilinogen, Ur: 0.2 mg/dL (ref 0.2–1.0)
pH, UA: 5.5 (ref 5.0–7.5)

## 2019-10-28 NOTE — Progress Notes (Signed)
Urological Symptom Review  Patient is experiencing the following symptoms: Hard to postpone urination Get up at night to urinate   Review of Systems  Gastrointestinal (upper)  : Negative for upper GI symptoms  Gastrointestinal (lower) : Negative for lower GI symptoms  Constitutional : Negative for symptoms  Skin: Negative for skin symptoms  Eyes: Negative for eye symptoms  Ear/Nose/Throat : Negative for Ear/Nose/Throat symptoms  Hematologic/Lymphatic: Negative for Hematologic/Lymphatic symptoms  Cardiovascular : Negative for cardiovascular symptoms  Respiratory : Negative for respiratory symptoms  Endocrine: Negative for endocrine symptoms  Musculoskeletal: Negative for musculoskeletal symptoms  Neurological: Negative for neurological symptoms  Psychologic: Negative for psychiatric symptoms

## 2019-10-28 NOTE — Patient Instructions (Signed)
Prostate-Specific Antigen Test Why am I having this test? The prostate-specific antigen (PSA) test is a screening test for prostate cancer. It can identify early signs of prostate cancer, which may allow for more effective treatment. Your health care provider may recommend that you have a PSA test starting at age 63 or that you have one earlier or later, depending on your risk factors for prostate cancer. You may also have a PSA test:  To monitor treatment of prostate cancer.  To check whether prostate cancer has returned after treatment.  If you have signs of other conditions that can affect PSA levels, such as: ? An enlarged prostate that is not caused by cancer (benign prostatic hyperplasia, BPH). This condition is very common in older men. ? A prostate infection. What is being tested? This test measures the amount of PSA in your blood. PSA is a protein that is made in the prostate. The prostate naturally produces more PSA as you age, but very high levels may be a sign of a medical condition. What kind of sample is taken?  A blood sample is required for this test. It is usually collected by inserting a needle into a blood vessel or by sticking a finger with a small needle. Blood for this test should be drawn before having an exam of the prostate. How do I prepare for this test? Do not ejaculate starting 24 hours before your test, or as long as told by your health care provider. Tell a health care provider about:  Any allergies you have.  All medicines you are taking, including vitamins, herbs, eye drops, creams, and over-the-counter medicines. This also includes: ? Medicines to assist with hair growth, such as finasteride. ? Any recent exposure to a medicine called diethylstilbestrol.  Any blood disorders you have.  Any recent procedures you have had, especially any procedures involving the prostate or rectum.  Any medical conditions you have.  Any recent urinary tract infections  (UTIs) you have had. How are the results reported? Your test results will be reported as a value that indicates how much PSA is in your blood. This will be given as nanograms of PSA per milliliter of blood (ng/mL). Your health care provider will compare your results to normal ranges that were established after testing a large group of people (reference ranges). Reference ranges may vary among labs and hospitals. PSA levels vary from person to person and generally increase with age. Because of this variation, there is no single PSA value that is considered normal for everyone. Instead, PSA reference ranges are used to describe whether your PSA levels are considered low or high (elevated). Common reference ranges are:  Low: 0-2.5 ng/mL.  Slightly to moderately elevated: 2.6-10.0 ng/mL.  Moderately elevated: 10.0-19.9 ng/mL.  Significantly elevated: 20 ng/mL or greater. Sometimes, the test results may report that a condition is present when it is not present (false-positive result). What do the results mean? A test result that is higher than 4 ng/mL may mean that you are at an increased risk for prostate cancer. However, a PSA test by itself is not enough to diagnose prostate cancer. High PSA levels may also be caused by the natural aging process, prostate infection, or BPH. PSA screening cannot tell you if your PSA is high due to cancer or a different cause. A prostate biopsy is the only way to diagnose prostate cancer. A risk of having the PSA test is diagnosing and treating prostate cancer that would never have caused any   symptoms or problems (overdiagnosis and overtreatment). Talk with your health care provider about what your results mean. Questions to ask your health care provider Ask your health care provider, or the department that is doing the test:  When will my results be ready?  How will I get my results?  What are my treatment options?  What other tests do I need?  What are my  next steps? Summary  The prostate-specific antigen (PSA) test is a screening test for prostate cancer.  Your health care provider may recommend that you have a PSA test starting at age 63 or that you have one earlier or later, depending on your risk factors for prostate cancer.  A test result that is higher than 4 ng/mL may mean that you are at an increased risk for prostate cancer. However, elevated levels can be caused by a number of conditions other than prostate cancer.  Talk with your health care provider about what your results mean. This information is not intended to replace advice given to you by your health care provider. Make sure you discuss any questions you have with your health care provider. Document Revised: 01/18/2017 Document Reviewed: 11/12/2016 Elsevier Patient Education  2020 [employer]  

## 2019-10-28 NOTE — Progress Notes (Signed)
10/28/2019 9:12 AM   George Love [birth year] [id number]  Referring provider: Hulan Fess, MD [place],  [place] [address]  Elevated PSA  HPI: [name] is a 64yo here for followup for elevated PSA and nocturia. No recent PSA. He has nocturia 1-3x depending on fluid consumption.    His records from [facility] are as follows: My PSA is elevated above the normal range.  HPI: George Love is a 64 year-old male established patient who is here for an elevated PSA.  His PSA is 7.4. He has had PSA's drawn prior to this one. He has had elevated PSA's prior to this one. He indicates there were no urinary problems when the PSA was drawn. He has not had a prostate nodule on a physical examination. He has not had recurrent prostate infections or chronic prostatitis.   He has not been on antibiotics for prostate infections previously. He has had a prostate biopsy done. His first prostate biopsy was done 01/21/2008. He does have the pathology report from his biopsy.   02/14/2017: He was previously seen by Dr. Janice Love for elevated PSA and has not seen [facility] since 2012. His PSA range was 5-7 when he saw Dr. Janice Love. He had 2 negative prostate biopsies in 2010. He denies LUTS except nocturia 1x. He has a weak stream in the morning.  His last PSA was 5.86 in 10/2015 with 18% free.    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 His PSA is 8.9 down from 10.   11/14/2017: Patient is here for MRI fusion biopsy   11/22/2017: Prostate biopsy showed normal prostate tissue   05/29/2018: PSA is stable at 8.4 with 25% free     I get up too often at night to urinate.  HPI: He first noticed the symptom approximately 01/20/2011. He usually gets up at night to urinate 3 times. He does not have nights when he does not get up to urinate at all. He does not have trouble falling back asleep once he has been woken up at night.   He does not usually have swelling in his hands and feet during the day.  He does not take a diuretic. He does not have to strain or bear down to start his urinary stream.   10/18/2017: He has stable nocturia 1x. He notes a weaker stream in the morning which then resolves throughout the day   05/29/2018: His nocturia worsened to 3x. he has tried fluid management which improved his nocturia. he is not bothered by his nocturia     AUA Symptom Score: Less than 50% of the time he has the sensation of not emptying his bladder completely when finished urinating. Less than 20% of the time he has to urinate again fewer than two hours after he has finished urinating. Less than 20% of the time he has to start and stop again several times when he urinates. Less than 50% of the time he finds it difficult to postpone urination. Less than 20% of the time he has a weak urinary stream. Less than 20% of the time he has to push or strain to begin urination. He has to get up to urinate 1 time from the time he goes to bed until the time he gets up in the morning.   Calculated AUA Symptom Score: 9      PMH: No past medical history on file.  Surgical History:   Home Medications:  Allergies as of  10/28/2019   Not on File     Medication List       Accurate as of October 28, 2019  9:12 AM. If you have any questions, ask your nurse or doctor.        cholecalciferol 25 MCG (1000 UNIT) tablet Commonly known as: VITAMIN D3 Take 1,000 Units by mouth daily.   escitalopram 10 MG tablet Commonly known as: LEXAPRO Take 10 mg by mouth daily.   multivitamin tablet Take 1 tablet by mouth daily.   pantoprazole 40 MG tablet Commonly known as: PROTONIX Take 40 mg by mouth daily.   vitamin C 100 MG tablet Take 100 mg by mouth daily.   zinc sulfate 220 (50 Zn) MG capsule Take 220 mg by mouth daily.       Allergies: Not on File  Family History: No family history on file.  Social History:  has no history on file for tobacco use, alcohol use, and drug use.  ROS: All other  review of systems were reviewed and are negative except what is noted above in HPI  Physical Exam: There were no vitals taken for this visit.  Constitutional:  Alert and oriented, No acute distress. HEENT: [place] AT, moist mucus membranes.  Trachea midline, no masses. Cardiovascular: No clubbing, cyanosis, or edema. Respiratory: Normal respiratory effort, no increased work of breathing. GI: Abdomen is soft, nontender, nondistended, no abdominal masses GU: No CVA tenderness. Circumcised phallus. No masses/lesions on penis, testis, scrotum. Prostate 40g smooth no nodules no induration.  Lymph: No cervical or inguinal lymphadenopathy. Skin: No rashes, bruises or suspicious lesions. Neurologic: Grossly intact, no focal deficits, moving all 4 extremities. Psychiatric: Normal mood and affect.  Laboratory Data: No results found for: WBC, HGB, HCT, MCV, PLT  No results found for: CREATININE  No results found for: PSA  No results found for: TESTOSTERONE  No results found for: HGBA1C  Urinalysis No results found for: COLORURINE, APPEARANCEUR, LABSPEC, PHURINE, GLUCOSEU, HGBUR, BILIRUBINUR, KETONESUR, PROTEINUR, UROBILINOGEN, NITRITE, LEUKOCYTESUR  No results found for: LABMICR, [place], RBCUA, LABEPIT, MUCUS, BACTERIA  Pertinent Imaging:  No results found for this or any previous visit.  No results found for this or any previous visit.  No results found for this or any previous visit.  No results found for this or any previous visit.  No results found for this or any previous visit.  No results found for this or any previous visit.  No results found for this or any previous visit.  No results found for this or any previous visit.   Assessment & Plan:    1. Elevated PSA -PSA today, if stable RTC 6 months with PSA - Urinalysis, Routine w reflex microscopic  2. Nocturia -Continue fluid management   No follow-ups on file.  Nicolette Bang, MD  [facility]  [place]

## 2019-10-29 LAB — PSA, TOTAL AND FREE
PSA, Free Pct: 21.6 %
PSA, Free: 1.38 ng/mL
Prostate Specific Ag, Serum: 6.4 ng/mL — ABNORMAL HIGH (ref 0.0–4.0)

## 2019-11-10 NOTE — Progress Notes (Signed)
Results sent via my chart 

## 2020-02-10 MED FILL — ESCITALOPRAM 10 MG TABLET: 10 | 90 days supply | Qty: 90 | Fill #1

## 2020-02-10 MED FILL — PANTOPRAZOLE SOD DR 40 MG T: 40 | 90 days supply | Qty: 90 | Fill #1

## 2020-04-28 ENCOUNTER — Ambulatory Visit: Payer: No Typology Code available for payment source | Admitting: Urology

## 2020-04-29 ENCOUNTER — Other Ambulatory Visit: Payer: Self-pay

## 2020-04-29 ENCOUNTER — Encounter: Payer: Self-pay | Admitting: Urology

## 2020-04-29 ENCOUNTER — Ambulatory Visit (INDEPENDENT_AMBULATORY_CARE_PROVIDER_SITE_OTHER): Payer: 59 | Admitting: Urology

## 2020-04-29 VITALS — BP 144/84 | HR 59 | Temp 98.5°F | Ht 72.0 in | Wt 240.0 lb

## 2020-04-29 DIAGNOSIS — R351 Nocturia: Secondary | ICD-10-CM | POA: Diagnosis not present

## 2020-04-29 DIAGNOSIS — R972 Elevated prostate specific antigen [PSA]: Secondary | ICD-10-CM

## 2020-04-29 LAB — BLADDER SCAN AMB NON-IMAGING: Scan Result: 29

## 2020-04-29 LAB — URINALYSIS, ROUTINE W REFLEX MICROSCOPIC
Bilirubin, UA: NEGATIVE
Glucose, UA: NEGATIVE
Ketones, UA: NEGATIVE
Leukocytes,UA: NEGATIVE
Nitrite, UA: NEGATIVE
Protein,UA: NEGATIVE
RBC, UA: NEGATIVE
Specific Gravity, UA: 1.02 (ref 1.005–1.030)
Urobilinogen, Ur: 0.2 mg/dL (ref 0.2–1.0)
pH, UA: 5.5 (ref 5.0–7.5)

## 2020-04-29 NOTE — Progress Notes (Signed)
Bladder Scan Patient can void: 29 ml Performed By: Durenda Guthrie, lpn   Urological Symptom Review  Patient is experiencing the following symptoms: Hard to postpone urination Get up at night to urinate   Review of Systems  Gastrointestinal (upper)  : Negative for upper GI symptoms  Gastrointestinal (lower) : Negative for lower GI symptoms  Constitutional : Negative for symptoms  Skin: Negative for skin symptoms  Eyes: Negative for eye symptoms  Ear/Nose/Throat : Negative for Ear/Nose/Throat symptoms  Hematologic/Lymphatic: Negative for Hematologic/Lymphatic symptoms  Cardiovascular : Negative for cardiovascular symptoms  Respiratory : Negative for respiratory symptoms  Endocrine: Negative for endocrine symptoms  Musculoskeletal: Negative for musculoskeletal symptoms  Neurological: Negative for neurological symptoms  Psychologic: Negative for psychiatric symptoms

## 2020-04-29 NOTE — Progress Notes (Signed)
04/29/2020 9:58 AM   Laurell Roof [birth year] [id number]  Referring provider: Hulan Fess, MD [place],  [place] [address]  Followup elevated PSA  HPI: Mr Stiff is a 65yo here for followup for elevated PSA. Last PSA was 6.4 in 10/2019. He has mild LUTS. IPSS 7 QOL 0. He is not on any BPH therapy. Nocturia 2x. His most bothersome LUT is urgency. He is not bothered enough to start BPH therapy.    PMH: No past medical history on file.  Surgical History: No past surgical history on file.  Home Medications:  Allergies as of 04/29/2020   No Known Allergies     Medication List       Accurate as of April 29, 2020  9:58 AM. If you have any questions, ask your nurse or doctor.        cholecalciferol 25 MCG (1000 UNIT) tablet Commonly known as: VITAMIN D3 Take 1,000 Units by mouth daily.   escitalopram 10 MG tablet Commonly known as: LEXAPRO Take 10 mg by mouth daily.   multivitamin tablet Take 1 tablet by mouth daily.   pantoprazole 40 MG tablet Commonly known as: PROTONIX Take 40 mg by mouth daily.   Red Yeast Rice 600 MG Caps 2 capsules   vitamin C 100 MG tablet Take 100 mg by mouth daily.   zinc sulfate 220 (50 Zn) MG capsule Take 220 mg by mouth daily.       Allergies: No Known Allergies  Family History: No family history on file.  Social History:  reports that he quit smoking about 6 years ago. His smoking use included cigarettes. He has a 20.00 pack-year smoking history. He has never used smokeless tobacco. He reports current alcohol use. No history on file for drug use.  ROS: All other review of systems were reviewed and are negative except what is noted above in HPI  Physical Exam: BP (!) 144/84   Pulse (!) 59   Temp 98.5 F (36.9 C)   Ht 6' (1.829 m)   Wt 240 lb (108.9 kg)   BMI 32.55 kg/m   Constitutional:  Alert and oriented, No acute distress. HEENT:  AT, moist mucus membranes.  Trachea midline, no  masses. Cardiovascular: No clubbing, cyanosis, or edema. Respiratory: Normal respiratory effort, no increased work of breathing. GI: Abdomen is soft, nontender, nondistended, no abdominal masses GU: No CVA tenderness.  Lymph: No cervical or inguinal lymphadenopathy. Skin: No rashes, bruises or suspicious lesions. Neurologic: Grossly intact, no focal deficits, moving all 4 extremities. Psychiatric: Normal mood and affect.  Laboratory Data: No results found for: WBC, HGB, HCT, MCV, PLT  No results found for: CREATININE  No results found for: PSA  No results found for: TESTOSTERONE  No results found for: HGBA1C  Urinalysis    Component Value Date/Time   APPEARANCEUR Clear 10/28/2019 0925   GLUCOSEU Negative 10/28/2019 0925   BILIRUBINUR Negative 10/28/2019 0925   PROTEINUR Negative 10/28/2019 0925   NITRITE Negative 10/28/2019 0925   LEUKOCYTESUR Negative 10/28/2019 0925    Lab Results  Component Value Date   LABMICR Comment 10/28/2019    Pertinent Imaging:  No results found for this or any previous visit.  No results found for this or any previous visit.  No results found for this or any previous visit.  No results found for this or any previous visit.  No results found for this or any previous visit.  No results found for this or any previous  visit.  No results found for this or any previous visit.  No results found for this or any previous visit.   Assessment & Plan:    1. Elevated PSA -PSA today, will call with results. If stable RTC 6 month with PSA and DRE - Urinalysis, Routine w reflex microscopic - BLADDER SCAN AMB NON-IMAGING  2. Nocturia --observation   No follow-ups on file.  Nicolette Bang, MD  [facility] [place]

## 2020-04-29 NOTE — Patient Instructions (Signed)
  Prostate-Specific Antigen Test Why am I having this test? The prostate-specific antigen (PSA) test is a screening test for prostate cancer. It can identify early signs of prostate cancer, which may allow for more effective treatment. Your health care provider may recommend that you have a PSA test starting at age 64 or that you have one earlier or later, depending on your risk factors for prostate cancer. You may also have a PSA test:  To monitor treatment of prostate cancer.  To check whether prostate cancer has returned after treatment.  If you have signs of other conditions that can affect PSA levels, such as: ? An enlarged prostate that is not caused by cancer (benign prostatic hyperplasia, BPH). This condition is very common in older men. ? A prostate infection. What is being tested? This test measures the amount of PSA in your blood. PSA is a protein that is made in the prostate. The prostate naturally produces more PSA as you age, but very high levels may be a sign of a medical condition. What kind of sample is taken? A blood sample is required for this test. It is usually collected by inserting a needle into a blood vessel or by sticking a finger with a small needle. Blood for this test should be drawn before having an exam of the prostate.   How do I prepare for this test? Do not ejaculate starting 24 hours before your test, or as long as told by your health care provider. Tell a health care provider about:  Any allergies you have.  All medicines you are taking, including vitamins, herbs, eye drops, creams, and over-the-counter medicines. This also includes: ? Medicines to assist with hair growth, such as finasteride. ? Any recent exposure to a medicine called diethylstilbestrol.  Any blood disorders you have.  Any recent procedures you have had, especially any procedures involving the prostate or rectum.  Any medical conditions you have.  Any recent urinary tract  infections (UTIs) you have had. How are the results reported? Your test results will be reported as a value that indicates how much PSA is in your blood. This will be given as nanograms of PSA per milliliter of blood (ng/mL). Your health care provider will compare your results to normal ranges that were established after testing a large group of people (reference ranges). Reference ranges may vary among labs and hospitals. PSA levels vary from person to person and generally increase with age. Because of this variation, there is no single PSA value that is considered normal for everyone. Instead, PSA reference ranges are used to describe whether your PSA levels are considered low or high (elevated). Common reference ranges are:  Low: 0-2.5 ng/mL.  Slightly to moderately elevated: 2.6-10.0 ng/mL.  Moderately elevated: 10.0-19.9 ng/mL.  Significantly elevated: 20 ng/mL or greater. Sometimes, the test results may report that a condition is present when it is not present (false-positive result). What do the results mean? A test result that is higher than 4 ng/mL may mean that you are at an increased risk for prostate cancer. However, a PSA test by itself is not enough to diagnose prostate cancer. High PSA levels may also be caused by the natural aging process, prostate infection, or BPH. PSA screening cannot tell you if your PSA is high due to cancer or a different cause. A prostate biopsy is the only way to diagnose prostate cancer. A risk of having the PSA test is diagnosing and treating prostate cancer that would never   have caused any symptoms or problems (overdiagnosis and overtreatment). Talk with your health care provider about what your results mean. Questions to ask your health care provider Ask your health care provider, or the department that is doing the test:  When will my results be ready?  How will I get my results?  What are my treatment options?  What other tests do I  need?  What are my next steps? Summary  The prostate-specific antigen (PSA) test is a screening test for prostate cancer.  Your health care provider may recommend that you have a PSA test starting at age 64 or that you have one earlier or later, depending on your risk factors for prostate cancer.  A test result that is higher than 4 ng/mL may mean that you are at an increased risk for prostate cancer. However, elevated levels can be caused by a number of conditions other than prostate cancer.  Talk with your health care provider about what your results mean. This information is not intended to replace advice given to you by your health care provider. Make sure you discuss any questions you have with your health care provider. Document Revised: 10/22/2019 Document Reviewed: 10/22/2019 Elsevier Patient Education  2021 Elsevier Inc.  

## 2020-04-30 LAB — PSA: Prostate Specific Ag, Serum: 6.9 ng/mL — ABNORMAL HIGH (ref 0.0–4.0)

## 2020-05-03 NOTE — Progress Notes (Signed)
Pt notified through mychart.

## 2020-05-20 MED FILL — ESCITALOPRAM 10 MG TABLET: 10 | 90 days supply | Qty: 90 | Fill #2

## 2020-05-20 MED FILL — PANTOPRAZOLE SOD DR 40 MG T: 40 | 90 days supply | Qty: 90 | Fill #2

## 2020-06-06 DIAGNOSIS — H524 Presbyopia: Secondary | ICD-10-CM | POA: Diagnosis not present

## 2020-06-06 DIAGNOSIS — H52223 Regular astigmatism, bilateral: Secondary | ICD-10-CM | POA: Diagnosis not present

## 2020-08-12 ENCOUNTER — Other Ambulatory Visit (HOSPITAL_BASED_OUTPATIENT_CLINIC_OR_DEPARTMENT_OTHER): Payer: Self-pay

## 2020-08-12 MED ORDER — CARESTART COVID-19 HOME TEST VI KIT
PACK | 0 refills | Status: DC
  Filled 2020-08-12: qty 4, 4d supply, fill #0

## 2020-08-19 ENCOUNTER — Other Ambulatory Visit (HOSPITAL_BASED_OUTPATIENT_CLINIC_OR_DEPARTMENT_OTHER): Payer: Self-pay

## 2020-08-19 MED ORDER — ESCITALOPRAM OXALATE 10 MG PO TABS
ORAL_TABLET | ORAL | 0 refills | Status: DC
  Filled 2020-08-19: qty 30, 30d supply, fill #0

## 2020-08-19 MED ORDER — PANTOPRAZOLE SODIUM 40 MG PO TBEC
DELAYED_RELEASE_TABLET | ORAL | 0 refills | Status: DC
  Filled 2020-08-19: qty 30, 30d supply, fill #0

## 2020-08-23 ENCOUNTER — Encounter: Payer: Self-pay | Admitting: Medical

## 2020-08-23 ENCOUNTER — Other Ambulatory Visit (HOSPITAL_BASED_OUTPATIENT_CLINIC_OR_DEPARTMENT_OTHER): Payer: Self-pay

## 2020-08-23 ENCOUNTER — Ambulatory Visit: Payer: 59 | Admitting: Medical

## 2020-08-23 ENCOUNTER — Other Ambulatory Visit: Payer: Self-pay

## 2020-08-23 VITALS — BP 139/70 | HR 63 | Temp 98.5°F | Resp 18 | Ht 73.0 in | Wt 263.8 lb

## 2020-08-23 DIAGNOSIS — K635 Polyp of colon: Secondary | ICD-10-CM

## 2020-08-23 DIAGNOSIS — Z0000 Encounter for general adult medical examination without abnormal findings: Secondary | ICD-10-CM

## 2020-08-23 DIAGNOSIS — R972 Elevated prostate specific antigen [PSA]: Secondary | ICD-10-CM

## 2020-08-23 DIAGNOSIS — K219 Gastro-esophageal reflux disease without esophagitis: Secondary | ICD-10-CM | POA: Diagnosis not present

## 2020-08-23 DIAGNOSIS — E785 Hyperlipidemia, unspecified: Secondary | ICD-10-CM | POA: Diagnosis not present

## 2020-08-23 DIAGNOSIS — F419 Anxiety disorder, unspecified: Secondary | ICD-10-CM | POA: Diagnosis not present

## 2020-08-23 DIAGNOSIS — Z113 Encounter for screening for infections with a predominantly sexual mode of transmission: Secondary | ICD-10-CM | POA: Diagnosis not present

## 2020-08-23 LAB — CBC WITH DIFFERENTIAL/PLATELET
Basophils Absolute: 0 10*3/uL (ref 0.0–0.1)
Basophils Relative: 0.7 % (ref 0.0–3.0)
Eosinophils Absolute: 0.1 10*3/uL (ref 0.0–0.7)
Eosinophils Relative: 2.1 % (ref 0.0–5.0)
HCT: 40.4 % (ref 39.0–52.0)
Hemoglobin: 13.9 g/dL (ref 13.0–17.0)
Lymphocytes Relative: 26.3 % (ref 12.0–46.0)
Lymphs Abs: 1.5 10*3/uL (ref 0.7–4.0)
MCHC: 34.4 g/dL (ref 30.0–36.0)
MCV: 89.4 fl (ref 78.0–100.0)
Monocytes Absolute: 0.4 10*3/uL (ref 0.1–1.0)
Monocytes Relative: 7.6 % (ref 3.0–12.0)
Neutro Abs: 3.6 10*3/uL (ref 1.4–7.7)
Neutrophils Relative %: 63.3 % (ref 43.0–77.0)
Platelets: 226 10*3/uL (ref 150.0–400.0)
RBC: 4.52 Mil/uL (ref 4.22–5.81)
RDW: 13.3 % (ref 11.5–15.5)
WBC: 5.8 10*3/uL (ref 4.0–10.5)

## 2020-08-23 LAB — COMPREHENSIVE METABOLIC PANEL
ALT: 29 U/L (ref 0–53)
AST: 22 U/L (ref 0–37)
Albumin: 4.5 g/dL (ref 3.5–5.2)
Alkaline Phosphatase: 61 U/L (ref 39–117)
BUN: 17 mg/dL (ref 6–23)
CO2: 29 mEq/L (ref 19–32)
Calcium: 9.8 mg/dL (ref 8.4–10.5)
Chloride: 106 mEq/L (ref 96–112)
Creatinine, Ser: 0.89 mg/dL (ref 0.40–1.50)
GFR: 90.98 mL/min (ref >60.00)
Glucose, Bld: 95 mg/dL (ref 70–99)
Potassium: 4.3 mEq/L (ref 3.5–5.1)
Sodium: 142 mEq/L (ref 135–145)
Total Bilirubin: 0.5 mg/dL (ref 0.2–1.2)
Total Protein: 6.9 g/dL (ref 6.0–8.3)

## 2020-08-23 LAB — LIPID PANEL
Cholesterol: 171 mg/dL (ref 0–200)
HDL: 42.3 mg/dL (ref >39.00)
LDL Cholesterol: 101 mg/dL — ABNORMAL HIGH (ref 0–99)
NonHDL: 128.41
Total CHOL/HDL Ratio: 4
Triglycerides: 138 mg/dL (ref 0.0–149.0)
VLDL: 27.6 mg/dL (ref 0.0–40.0)

## 2020-08-23 MED ORDER — ESCITALOPRAM OXALATE 10 MG PO TABS
10.0000 mg | ORAL_TABLET | Freq: Every day | ORAL | 1 refills | Status: DC
  Filled 2020-08-23 – 2020-09-12 (×2): qty 90, 90d supply, fill #0
  Filled 2020-12-20: qty 90, 90d supply, fill #1

## 2020-08-23 MED ORDER — PANTOPRAZOLE SODIUM 40 MG PO TBEC
40.0000 mg | DELAYED_RELEASE_TABLET | Freq: Every day | ORAL | 1 refills | Status: DC
  Filled 2020-08-23 – 2020-09-12 (×2): qty 90, 90d supply, fill #0
  Filled 2020-12-20: qty 90, 90d supply, fill #1

## 2020-08-23 NOTE — Patient Instructions (Addendum)
For you wellness exam today I have ordered cbc, cmp, lipid panel and hiv.  Vaccine not given today. I don't have vaccine records. Will wait and review records when receive.  Recommend exercise and healthy diet.  We will let you know lab results as they come in.  Follow up date appointment will be determined after lab review.    For anxiety. Continue with lexapro.  For gerd continue with protonix.  For high cholesterol check lipid panel today to see if might need prescription med.  For elevated psa continue with urologist.  Sign release of med info form so can review and then refer for colonoscopy when due.  Preventive Care 64-65 Years Old, Male Preventive care refers to lifestyle choices and visits with your health care provider that can promote health and wellness. This includes: A yearly physical exam. This is also called an annual wellness visit. Regular dental and eye exams. Immunizations. Screening for certain conditions. Healthy lifestyle choices, such as: Eating a healthy diet. Getting regular exercise. Not using drugs or products that contain nicotine and tobacco. Limiting alcohol use. What can I expect for my preventive care visit? Physical exam Your health care provider will check your: Height and weight. These may be used to calculate your BMI (body mass index). BMI is a measurement that tells if you are at a healthy weight. Heart rate and blood pressure. Body temperature. Skin for abnormal spots. Counseling Your health care provider may ask you questions about your: Past medical problems. Family's medical history. Alcohol, tobacco, and drug use. Emotional well-being. Home life and relationship well-being. Sexual activity. Diet, exercise, and sleep habits. Work and work Statistician. Access to firearms. What immunizations do I need?  Vaccines are usually given at various ages, according to a schedule. Your health care provider will recommend vaccines for  you based on your age, medicalhistory, and lifestyle or other factors, such as travel or where you work. What tests do I need? Blood tests Lipid and cholesterol levels. These may be checked every 5 years, or more often if you are over 29 years old. Hepatitis C test. Hepatitis B test. Screening Lung cancer screening. You may have this screening every year starting at age 64 if you have a 30-pack-year history of smoking and currently smoke or have quit within the past 15 years. Prostate cancer screening. Recommendations will vary depending on your family history and other risks. Genital exam to check for testicular cancer or hernias. Colorectal cancer screening. All adults should have this screening starting at age 64 and continuing until age 42. Your health care provider may recommend screening at age 64 if you are at increased risk. You will have tests every 1-10 years, depending on your results and the type of screening test. Diabetes screening. This is done by checking your blood sugar (glucose) after you have not eaten for a while (fasting). You may have this done every 1-3 years. STD (sexually transmitted disease) testing, if you are at risk. Follow these instructions at home: Eating and drinking  Eat a diet that includes fresh fruits and vegetables, whole grains, lean protein, and low-fat dairy products. Take vitamin and mineral supplements as recommended by your health care provider. Do not drink alcohol if your health care provider tells you not to drink. If you drink alcohol: Limit how much you have to 0-2 drinks a day. Be aware of how much alcohol is in your drink. In the [place] one drink equals one 12 oz bottle of beer (355  mL), one 5 oz glass of wine (148 mL), or one 1 oz glass of hard liquor (44 mL).  Lifestyle Take daily care of your teeth and gums. Brush your teeth every morning and night with fluoride toothpaste. Floss one time each day. Stay active. Exercise for at least  30 minutes 5 or more days each week. Do not use any products that contain nicotine or tobacco, such as cigarettes, e-cigarettes, and chewing tobacco. If you need help quitting, ask your health care provider. Do not use drugs. If you are sexually active, practice safe sex. Use a condom or other form of protection to prevent STIs (sexually transmitted infections). If told by your health care provider, take low-dose aspirin daily starting at age 64. Find healthy ways to cope with stress, such as: Meditation, yoga, or listening to music. Journaling. Talking to a trusted person. Spending time with friends and family. Safety Always wear your seat belt while driving or riding in a vehicle. Do not drive: If you have been drinking alcohol. Do not ride with someone who has been drinking. When you are tired or distracted. While texting. Wear a helmet and other protective equipment during sports activities. If you have firearms in your house, make sure you follow all gun safety procedures. What's next? Go to your health care provider once a year for an annual wellness visit. Ask your health care provider how often you should have your eyes and teeth checked. Stay up to date on all vaccines. This information is not intended to replace advice given to you by your health care provider. Make sure you discuss any questions you have with your healthcare provider. Document Revised: 11/04/2018 Document Reviewed: 01/30/2018 Elsevier Patient Education  2022 [employer].

## 2020-08-23 NOTE — Progress Notes (Signed)
Subjective:    Patient ID: George Love, male    DOB: February 07, 1956, 65 y.o.   MRN: [id number]  HPI  Pt in for first time.   Pt last physical was in 2021.  Decided to go ahead and do wellness exam today.  Pt wife works for [employer]. 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 Pt states will exercise intermittently. He states when in country will work out. He travels a lot overseas will exercise less. Pt states just this month he bought equipment for his work.   Pt travels a lot. He will run out of meds before 5 weeks. Pt travels to [place] a lot.   Pt is on lexapro for anxiety.  States started having anxiety about 15 years. States med controlled his anxiety. He denies depression. At times he wonders if can stop medication for anxiety. GAD 7 score 11.  Hx of gerd. Pt has been on ppi for years. Currently on Protonix. At times was on other ppi.   Hx of elevated PSA in the past. He is followed by urologist and had negative biopsy. He had negative biopsy in the past. He states told high level range. Pt continue to follow up every 6 months.  Pt states he has had colon polyps in past. He is having repeat colonoscopies every 5 yrs. He is not sure when next one is.   Pt sees dermatologist regularly.    Review of Systems  Constitutional:  Negative for chills, fatigue and fever.  HENT:  Negative for congestion, ear pain, hearing loss, nosebleeds, sinus pressure, sneezing and sore throat.   Respiratory:  Negative for cough, choking, shortness of breath and wheezing.   Cardiovascular:  Negative for chest pain and palpitations.  Gastrointestinal:  Negative for abdominal distention, abdominal pain, constipation, diarrhea, rectal pain and vomiting.  Genitourinary:  Negative for dysuria, flank pain, frequency, penile pain and urgency.  Musculoskeletal:  Negative for back pain, joint swelling and neck pain.  Skin:  Negative for color change and rash.  Neurological:  Negative for dizziness,  seizures, syncope, weakness, numbness and headaches.  Hematological:  Negative for adenopathy. Does not bruise/bleed easily.  Psychiatric/Behavioral:  Negative for agitation, confusion, sleep disturbance and suicidal ideas. The patient is not nervous/anxious.     No past medical history on file.   Social History   Socioeconomic History   Marital status: Married    Spouse name: Not on file   Number of children: Not on file   Years of education: Not on file   Highest education level: Not on file  Occupational History   Not on file  Tobacco Use   Smoking status: Former    Packs/day: 1.00    Years: 20.00    Pack years: 20.00    Types: Cigarettes    Quit date: 10/27/2013    Years since quitting: 6.8   Smokeless tobacco: Never  Substance and Sexual Activity   Alcohol use: Yes    Comment: socially    Drug use: Not on file   Sexual activity: Not on file  Other Topics Concern   Not on file  Social History Narrative   Not on file   Social Determinants of Health   Financial Resource Strain: Not on file  Food Insecurity: Not on file  Transportation Needs: Not on file  Physical Activity: Not on file  Stress: Not on file  Social Connections: Not on file  Intimate Partner Violence: Not on file    No  past surgical history on file.  No family history on file.  No Known Allergies  Current Outpatient Medications on File Prior to Visit  Medication Sig Dispense Refill   Ascorbic Acid (VITAMIN C) 100 MG tablet Take 100 mg by mouth daily.     cholecalciferol (VITAMIN D3) 25 MCG (1000 UNIT) tablet Take 1,000 Units by mouth daily.     COVID-19 At Home Antigen Test [facility] COVID-19 HOME TEST) KIT Use as directed per package instructions 4 each 0   escitalopram (LEXAPRO) 10 MG tablet Take 10 mg by mouth daily.     escitalopram (LEXAPRO) 10 MG tablet Take 1 tablet by mouth once daily **Need office visit for further refills** 30 tablet 0   Multiple Vitamin (MULTIVITAMIN) tablet Take 1  tablet by mouth daily.     pantoprazole (PROTONIX) 40 MG tablet Take 40 mg by mouth daily.     pantoprazole (PROTONIX) 40 MG tablet Take 1 tablet by mouth once daily **Needs office visit for further refills** 30 tablet 0   Red Yeast Rice 600 MG CAPS 2 capsules     zinc sulfate 220 (50 Zn) MG capsule Take 220 mg by mouth daily.     escitalopram (LEXAPRO) 10 MG tablet TAKE 1 TABLET BY MOUTH ONCE DAILY 90 tablet 2   pantoprazole (PROTONIX) 40 MG tablet TAKE 1 TABLET BY MOUTH ONCE DAILY 90 tablet 2   No current facility-administered medications on file prior to visit.    BP (!) 149/90   Pulse 63   Temp 98.5 F (36.9 C)   Resp 18   Ht 6' 1"  (1.854 m)   Wt 263 lb 12.8 oz (119.7 kg)   SpO2 97%   BMI 34.80 kg/m       Objective:   Physical Exam   General Mental Status- Alert. General Appearance- Not in acute distress.   Skin General: Color- Normal Color. Moisture- Normal Moisture.  Neck Carotid Arteries- Normal color. Moisture- Normal Moisture. No carotid bruits. No JVD.  Chest and Lung Exam Auscultation: Breath Sounds:-Normal.  Cardiovascular Auscultation:Rythm- Regular. Murmurs & Other Heart Sounds:Auscultation of the heart reveals- No Murmurs.  Abdomen Inspection:-Inspection Normal. Palpation/Percussion:Note:No mass. Palpation and Percussion of the abdomen reveal- Non Tender, Non Distended + BS, no rebound or guarding.   Neurologic Cranial Nerve exam:- CN III-XII intact(No nystagmus), symmetric smile. Strength:- 5/5 equal and symmetric strength both upper and lower extremities.      Assessment & Plan:  For you wellness exam today I have ordered cbc, cmp, lipid panel and hiv.  Vaccine not given today. I don't have vaccine records. Will wait and review records when receive.  Recommend exercise and healthy diet.  We will let you know lab results as they come in.  Follow up date appointment will be determined after lab review.    For anxiety. Continue with  lexapro.  For gerd continue with protonix.  For high cholesterol check lipid panel today to see if might need prescription med.  For elevated psa continue with urologist.  Sign release of med info form so can review and then refer for colonoscopy when due.  Mackie Pai, PA-C

## 2020-08-24 LAB — HIV ANTIBODY (ROUTINE TESTING W REFLEX): HIV 1&2 Ab, 4th Generation: NONREACTIVE

## 2020-08-29 ENCOUNTER — Telehealth: Payer: Self-pay | Admitting: Medical

## 2020-08-29 NOTE — Telephone Encounter (Signed)
Colonoscopy abstracted into chart , records placed in scan

## 2020-08-29 NOTE — Telephone Encounter (Signed)
I signed pt records to scan. I did not see he ever had pneumonia vaccine and could not find date of colonoscopy. Will you review and see if you can find. If so abstract then place to scan.

## 2020-08-30 DIAGNOSIS — L738 Other specified follicular disorders: Secondary | ICD-10-CM | POA: Diagnosis not present

## 2020-08-30 DIAGNOSIS — D485 Neoplasm of uncertain behavior of skin: Secondary | ICD-10-CM | POA: Diagnosis not present

## 2020-08-30 DIAGNOSIS — D2271 Melanocytic nevi of right lower limb, including hip: Secondary | ICD-10-CM | POA: Diagnosis not present

## 2020-08-30 DIAGNOSIS — D2261 Melanocytic nevi of right upper limb, including shoulder: Secondary | ICD-10-CM | POA: Diagnosis not present

## 2020-08-30 DIAGNOSIS — D2272 Melanocytic nevi of left lower limb, including hip: Secondary | ICD-10-CM | POA: Diagnosis not present

## 2020-08-30 DIAGNOSIS — D224 Melanocytic nevi of scalp and neck: Secondary | ICD-10-CM | POA: Diagnosis not present

## 2020-08-30 DIAGNOSIS — L821 Other seborrheic keratosis: Secondary | ICD-10-CM | POA: Diagnosis not present

## 2020-08-30 DIAGNOSIS — D1801 Hemangioma of skin and subcutaneous tissue: Secondary | ICD-10-CM | POA: Diagnosis not present

## 2020-08-30 DIAGNOSIS — D225 Melanocytic nevi of trunk: Secondary | ICD-10-CM | POA: Diagnosis not present

## 2020-08-30 DIAGNOSIS — C44719 Basal cell carcinoma of skin of left lower limb, including hip: Secondary | ICD-10-CM | POA: Diagnosis not present

## 2020-09-12 ENCOUNTER — Other Ambulatory Visit (HOSPITAL_BASED_OUTPATIENT_CLINIC_OR_DEPARTMENT_OTHER): Payer: Self-pay

## 2020-10-25 ENCOUNTER — Other Ambulatory Visit: Payer: 59

## 2020-10-25 ENCOUNTER — Other Ambulatory Visit: Payer: Self-pay

## 2020-10-25 DIAGNOSIS — R972 Elevated prostate specific antigen [PSA]: Secondary | ICD-10-CM

## 2020-10-26 LAB — PSA, TOTAL AND FREE
PSA, Free Pct: 24.2 %
PSA, Free: 1.26 ng/mL
Prostate Specific Ag, Serum: 5.2 ng/mL — ABNORMAL HIGH (ref 0.0–4.0)

## 2020-10-31 ENCOUNTER — Other Ambulatory Visit: Payer: Self-pay

## 2020-10-31 ENCOUNTER — Ambulatory Visit (INDEPENDENT_AMBULATORY_CARE_PROVIDER_SITE_OTHER): Payer: 59 | Admitting: Urology

## 2020-10-31 ENCOUNTER — Encounter: Payer: Self-pay | Admitting: Urology

## 2020-10-31 DIAGNOSIS — R351 Nocturia: Secondary | ICD-10-CM | POA: Diagnosis not present

## 2020-10-31 DIAGNOSIS — R972 Elevated prostate specific antigen [PSA]: Secondary | ICD-10-CM

## 2020-10-31 NOTE — Patient Instructions (Signed)
Prostate-Specific Antigen Test Why am I having this test? The prostate-specific antigen (PSA) test is a screening test for prostate cancer. It can identify early signs of prostate cancer, which may allow for more effective treatment. Your health care provider may recommend that you have a PSA test starting at age 64 or that you have one earlier or later, depending on your risk factors for prostate cancer. You may also have a PSA test: To monitor treatment of prostate cancer. To check whether prostate cancer has returned after treatment. If you have signs of other conditions that can affect PSA levels, such as: An enlarged prostate that is not caused by cancer (benign prostatic hyperplasia, BPH). This condition is very common in older men. A prostate infection. What is being tested? This test measures the amount of PSA in your blood. PSA is a protein that is made in the prostate. The prostate naturally produces more PSA as you age, but very high levels may be a sign of a medical condition. What kind of sample is taken? A blood sample is required for this test. It is usually collected by inserting a needle into a blood vessel or by sticking a finger with a small needle. Blood for this test should be drawn before having an exam of the prostate. How do I prepare for this test? Do not ejaculate starting 24 hours before your test, or as long as told by your health care provider. Tell a health care provider about: Any allergies you have. All medicines you are taking, including vitamins, herbs, eye drops, creams, and over-the-counter medicines. This also includes: Medicines to assist with hair growth, such as finasteride. Any recent exposure to a medicine called diethylstilbestrol. Any blood disorders you have. Any recent procedures you have had, especially any procedures involving the prostate or rectum. Any medical conditions you have. Any recent urinary tract infections (UTIs) you have had. How are  the results reported? Your test results will be reported as a value that indicates how much PSA is in your blood. This will be given as nanograms of PSA per milliliter of blood (ng/mL). Your health care provider will compare your results to normal ranges that were established after testing a large group of people (reference ranges). Reference ranges may vary among labs and hospitals. PSA levels vary from person to person and generally increase with age. Because of this variation, there is no single PSA value that is considered normal for everyone. Instead, PSA reference ranges are used to describe whether your PSA levels are considered low or high (elevated). Common reference ranges are: Low: 0-2.5 ng/mL. Slightly to moderately elevated: 2.6-10.0 ng/mL. Moderately elevated: 10.0-19.9 ng/mL. Significantly elevated: 20 ng/mL or greater. Sometimes, the test results may report that a condition is present when it is not present (false-positive result). What do the results mean? A test result that is higher than 4 ng/mL may mean that you are at an increased risk for prostate cancer. However, a PSA test by itself is not enough to diagnose prostate cancer. High PSA levels may also be caused by the natural aging process, prostate infection, or BPH. PSA screening cannot tell you if your PSA is high due to cancer or a different cause. A prostate biopsy is the only way to diagnose prostate cancer. A risk of having the PSA test is diagnosing and treating prostate cancer that would never have caused any symptoms or problems (overdiagnosis and overtreatment). Talk with your health care provider about what your results mean. Questions  to ask your health care provider Ask your health care provider, or the department that is doing the test: When will my results be ready? How will I get my results? What are my treatment options? What other tests do I need? What are my next steps? Summary The prostate-specific  antigen (PSA) test is a screening test for prostate cancer. Your health care provider may recommend that you have a PSA test starting at age 64 or that you have one earlier or later, depending on your risk factors for prostate cancer. A test result that is higher than 4 ng/mL may mean that you are at an increased risk for prostate cancer. However, elevated levels can be caused by a number of conditions other than prostate cancer. Talk with your health care provider about what your results mean. This information is not intended to replace advice given to you by your health care provider. Make sure you discuss any questions you have with your health care provider. Document Revised: 10/22/2019 Document Reviewed: 10/22/2019 Elsevier Patient Education  2022 [employer].

## 2020-10-31 NOTE — Progress Notes (Signed)
10/31/2020 9:37 AM   George Love Apr 13, 1955 [id number]  Referring provider: Hulan Fess, MD [place],  [place] [address]  Patient location: home Physician location: office I connected with  George Love on 10/31/20 by a video enabled telemedicine application and verified that I am speaking with the correct person using two identifiers.   I discussed the limitations of evaluation and management by telemedicine. The patient expressed understanding and agreed to proceed.    HPI: George Love is a 65yo here for followup for elevated PSA and nocturia. PSA decreased to 5.2 from 6.9. He denies any worsening LUTS. He has moderate LUTS on no BPH therapy. He has stable nocturia 2x. Urine stream is strong once he is able to initiate his stream.    PMH: Past Medical History:  Diagnosis Date   Anxiety    GERD (gastroesophageal reflux disease)    Hyperlipidemia     Surgical History: Past Surgical History:  Procedure Laterality Date   KNEE ARTHROSCOPY Right    PROSTATE SURGERY      Home Medications:  Allergies as of 10/31/2020   No Known Allergies      Medication List        Accurate as of October 31, 2020  9:37 AM. If you have any questions, ask your nurse or doctor.          [employer] COVID-19 Home Test Kit Generic drug: COVID-19 At Home Antigen Test Use as directed per package instructions   cholecalciferol 25 MCG (1000 UNIT) tablet Commonly known as: VITAMIN D3 Take 1,000 Units by mouth daily.   escitalopram 10 MG tablet Commonly known as: LEXAPRO TAKE 1 TABLET BY MOUTH ONCE DAILY   escitalopram 10 MG tablet Commonly known as: Lexapro Take 1 tablet (10 mg total) by mouth daily.   multivitamin tablet Take 1 tablet by mouth daily.   pantoprazole 40 MG tablet Commonly known as: PROTONIX TAKE 1 TABLET BY MOUTH ONCE DAILY   pantoprazole 40 MG tablet Commonly known as: PROTONIX Take 1 tablet (40 mg total) by mouth daily.   Red Yeast  Rice 600 MG Caps 2 capsules   vitamin C 100 MG tablet Take 100 mg by mouth daily.   zinc sulfate 220 (50 Zn) MG capsule Take 220 mg by mouth daily.        Allergies: No Known Allergies  Family History: No family history on file.  Social History:  reports that he quit smoking about 7 years ago. His smoking use included cigarettes. He has a 20.00 pack-year smoking history. He has never used smokeless tobacco. He reports current alcohol use. He reports that he does not use drugs.  ROS: All other review of systems were reviewed and are negative except what is noted above in HPI   Laboratory Data: Lab Results  Component Value Date   WBC 5.8 08/23/2020   HGB 13.9 08/23/2020   HCT 40.4 08/23/2020   MCV 89.4 08/23/2020   PLT 226.0 08/23/2020    Lab Results  Component Value Date   CREATININE 0.89 08/23/2020    No results found for: PSA  No results found for: TESTOSTERONE  No results found for: HGBA1C  Urinalysis    Component Value Date/Time   APPEARANCEUR Clear 04/29/2020 0950   GLUCOSEU Negative 04/29/2020 0950   BILIRUBINUR Negative 04/29/2020 0950   PROTEINUR Negative 04/29/2020 0950   NITRITE Negative 04/29/2020 0950   LEUKOCYTESUR Negative 04/29/2020 0950    Lab Results  Component Value  Date   LABMICR Comment 04/29/2020    Pertinent Imaging:  No results found for this or any previous visit.  No results found for this or any previous visit.  No results found for this or any previous visit.  No results found for this or any previous visit.  No results found for this or any previous visit.  No results found for this or any previous visit.  No results found for this or any previous visit.  No results found for this or any previous visit.   Assessment & Plan:    1. Elevated PSA -RTC 1 year with PSA  2. Nocturia -defers therapy at this time   No follow-ups on file.  Nicolette Bang, MD  [facility]

## 2020-12-20 ENCOUNTER — Other Ambulatory Visit (HOSPITAL_BASED_OUTPATIENT_CLINIC_OR_DEPARTMENT_OTHER): Payer: Self-pay

## 2020-12-20 DIAGNOSIS — M545 Low back pain, unspecified: Secondary | ICD-10-CM | POA: Diagnosis not present

## 2020-12-20 DIAGNOSIS — M546 Pain in thoracic spine: Secondary | ICD-10-CM | POA: Diagnosis not present

## 2020-12-20 DIAGNOSIS — M542 Cervicalgia: Secondary | ICD-10-CM | POA: Diagnosis not present

## 2021-03-14 ENCOUNTER — Other Ambulatory Visit (HOSPITAL_BASED_OUTPATIENT_CLINIC_OR_DEPARTMENT_OTHER): Payer: Self-pay

## 2021-03-14 ENCOUNTER — Other Ambulatory Visit: Payer: Self-pay | Admitting: Medical

## 2021-03-14 MED ORDER — PANTOPRAZOLE SODIUM 40 MG PO TBEC
40.0000 mg | DELAYED_RELEASE_TABLET | Freq: Every day | ORAL | 1 refills | Status: DC
  Filled 2021-03-14 – 2021-03-16 (×2): qty 90, 90d supply, fill #0
  Filled 2021-06-27: qty 90, 90d supply, fill #1

## 2021-03-14 MED ORDER — ESCITALOPRAM OXALATE 10 MG PO TABS
10.0000 mg | ORAL_TABLET | Freq: Every day | ORAL | 1 refills | Status: DC
  Filled 2021-03-14 – 2021-03-16 (×2): qty 90, 90d supply, fill #0
  Filled 2021-06-27: qty 90, 90d supply, fill #1

## 2021-03-16 ENCOUNTER — Other Ambulatory Visit (HOSPITAL_BASED_OUTPATIENT_CLINIC_OR_DEPARTMENT_OTHER): Payer: Self-pay

## 2021-04-14 ENCOUNTER — Other Ambulatory Visit (HOSPITAL_BASED_OUTPATIENT_CLINIC_OR_DEPARTMENT_OTHER): Payer: Self-pay

## 2021-04-14 DIAGNOSIS — M948X9 Other specified disorders of cartilage, unspecified sites: Secondary | ICD-10-CM | POA: Diagnosis not present

## 2021-04-14 DIAGNOSIS — H60501 Unspecified acute noninfective otitis externa, right ear: Secondary | ICD-10-CM | POA: Diagnosis not present

## 2021-04-14 MED ORDER — AMOXICILLIN-POT CLAVULANATE 875-125 MG PO TABS
ORAL_TABLET | ORAL | 0 refills | Status: DC
  Filled 2021-04-14: qty 20, 10d supply, fill #0

## 2021-04-14 MED ORDER — NEOMYCIN-POLYMYXIN-HC 3.5-10000-1 OT SUSP
OTIC | 0 refills | Status: DC
  Filled 2021-04-14: qty 10, 17d supply, fill #0

## 2021-06-27 ENCOUNTER — Other Ambulatory Visit (HOSPITAL_BASED_OUTPATIENT_CLINIC_OR_DEPARTMENT_OTHER): Payer: Self-pay

## 2021-08-15 DIAGNOSIS — H52223 Regular astigmatism, bilateral: Secondary | ICD-10-CM | POA: Diagnosis not present

## 2021-08-15 DIAGNOSIS — H524 Presbyopia: Secondary | ICD-10-CM | POA: Diagnosis not present

## 2021-08-23 ENCOUNTER — Encounter: Payer: Self-pay | Admitting: Medical

## 2021-08-23 ENCOUNTER — Ambulatory Visit (INDEPENDENT_AMBULATORY_CARE_PROVIDER_SITE_OTHER): Payer: 59 | Admitting: Medical

## 2021-08-23 VITALS — BP 140/80 | HR 62 | Temp 98.2°F | Resp 18 | Ht 73.0 in | Wt 258.0 lb

## 2021-08-23 DIAGNOSIS — R030 Elevated blood-pressure reading, without diagnosis of hypertension: Secondary | ICD-10-CM | POA: Diagnosis not present

## 2021-08-23 DIAGNOSIS — Z0001 Encounter for general adult medical examination with abnormal findings: Secondary | ICD-10-CM

## 2021-08-23 DIAGNOSIS — Z125 Encounter for screening for malignant neoplasm of prostate: Secondary | ICD-10-CM | POA: Diagnosis not present

## 2021-08-23 DIAGNOSIS — F419 Anxiety disorder, unspecified: Secondary | ICD-10-CM

## 2021-08-23 DIAGNOSIS — Z0000 Encounter for general adult medical examination without abnormal findings: Secondary | ICD-10-CM

## 2021-08-23 LAB — COMPREHENSIVE METABOLIC PANEL
ALT: 21 U/L (ref 0–53)
AST: 16 U/L (ref 0–37)
Albumin: 4.5 g/dL (ref 3.5–5.2)
Alkaline Phosphatase: 65 U/L (ref 39–117)
BUN: 23 mg/dL (ref 6–23)
CO2: 28 mEq/L (ref 19–32)
Calcium: 9.8 mg/dL (ref 8.4–10.5)
Chloride: 102 mEq/L (ref 96–112)
Creatinine, Ser: 0.89 mg/dL (ref 0.40–1.50)
GFR: 90.34 mL/min (ref >60.00)
Glucose, Bld: 98 mg/dL (ref 70–99)
Potassium: 4.2 mEq/L (ref 3.5–5.1)
Sodium: 140 mEq/L (ref 135–145)
Total Bilirubin: 0.6 mg/dL (ref 0.2–1.2)
Total Protein: 6.4 g/dL (ref 6.0–8.3)

## 2021-08-23 LAB — CBC WITH DIFFERENTIAL/PLATELET
Basophils Absolute: 0.1 10*3/uL (ref 0.0–0.1)
Basophils Relative: 0.8 % (ref 0.0–3.0)
Eosinophils Absolute: 0.2 10*3/uL (ref 0.0–0.7)
Eosinophils Relative: 3.1 % (ref 0.0–5.0)
HCT: 40.3 % (ref 39.0–52.0)
Hemoglobin: 13.6 g/dL (ref 13.0–17.0)
Lymphocytes Relative: 24.3 % (ref 12.0–46.0)
Lymphs Abs: 1.5 10*3/uL (ref 0.7–4.0)
MCHC: 33.7 g/dL (ref 30.0–36.0)
MCV: 90.2 fl (ref 78.0–100.0)
Monocytes Absolute: 0.5 10*3/uL (ref 0.1–1.0)
Monocytes Relative: 8.5 % (ref 3.0–12.0)
Neutro Abs: 3.9 10*3/uL (ref 1.4–7.7)
Neutrophils Relative %: 63.3 % (ref 43.0–77.0)
Platelets: 207 10*3/uL (ref 150.0–400.0)
RBC: 4.47 Mil/uL (ref 4.22–5.81)
RDW: 13 % (ref 11.5–15.5)
WBC: 6.1 10*3/uL (ref 4.0–10.5)

## 2021-08-23 LAB — LIPID PANEL
Cholesterol: 189 mg/dL (ref 0–200)
HDL: 43.3 mg/dL (ref >39.00)
NonHDL: 146.02
Total CHOL/HDL Ratio: 4
Triglycerides: 211 mg/dL — ABNORMAL HIGH (ref 0.0–149.0)
VLDL: 42.2 mg/dL — ABNORMAL HIGH (ref 0.0–40.0)

## 2021-08-23 LAB — PSA: PSA: 6.18 ng/mL — ABNORMAL HIGH (ref 0.10–4.00)

## 2021-08-23 LAB — LDL CHOLESTEROL, DIRECT: Direct LDL: 111 mg/dL

## 2021-08-23 NOTE — Progress Notes (Signed)
Subjective:    Patient ID: George Love, male    DOB: 11/11/1955, 66 y.o.   MRN: [id number]  HPI  Wellness exam today.   Pt wife works for [employer]. 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 Pt states will exercise intermittently. He states when in country will work out. He travels a lot overseas will exercise less. Pt states just this month he bought equipment for his work.    Pt travels a lot. He will run out of meds before 5 weeks. Pt travels to [place] a lot.  Tdap was done in 2016. Pt thinks he got shingrix vaccine. He states he got it done at specialist office in 2019 that did travel meicine.  Pt states told to have colonoscopy in 5 years from last.    Pt is on lexapro for anxiety.  States started having anxiety about 15 years. States med controlled his anxiety. He denies depression. At times he wonders if can stop medication for anxiety. GAD 6 score today .    Elevated blood pressure. Over past 5-6 years has been borderline. No bp machine at home.    Review of Systems  Constitutional:  Negative for chills, fatigue and fever.  HENT:  Negative for congestion, ear pain, hearing loss, nosebleeds, sinus pressure, sneezing and sore throat.   Respiratory:  Negative for cough, choking, shortness of breath and wheezing.   Cardiovascular:  Negative for chest pain and palpitations.  Gastrointestinal:  Negative for abdominal distention, abdominal pain, constipation, diarrhea, rectal pain and vomiting.  Genitourinary:  Negative for dysuria, flank pain, frequency, penile pain and urgency.  Musculoskeletal:  Negative for back pain, joint swelling and neck pain.  Skin:  Negative for color change and rash.  Neurological:  Negative for dizziness, seizures, syncope, weakness, numbness and headaches.  Hematological:  Negative for adenopathy. Does not bruise/bleed easily.  Psychiatric/Behavioral:  Negative for agitation, confusion, sleep disturbance and suicidal ideas. The patient is not  nervous/anxious.    Past Medical History:  Diagnosis Date   Anxiety    GERD (gastroesophageal reflux disease)    Hyperlipidemia      Social History   Socioeconomic History   Marital status: Married    Spouse name: Not on file   Number of children: Not on file   Years of education: Not on file   Highest education level: Not on file  Occupational History   Not on file  Tobacco Use   Smoking status: Former    Packs/day: 1.00    Years: 20.00    Total pack years: 20.00    Types: Cigarettes    Quit date: 10/27/2013    Years since quitting: 7.8   Smokeless tobacco: Never  Vaping Use   Vaping Use: Never used  Substance and Sexual Activity   Alcohol use: Yes    Comment: socially. 3-4 spready out over a week.   Drug use: Never   Sexual activity: Yes  Other Topics Concern   Not on file  Social History Narrative   Not on file   Social Determinants of Health   Financial Resource Strain: Not on file  Food Insecurity: Not on file  Transportation Needs: Not on file  Physical Activity: Not on file  Stress: Not on file  Social Connections: Not on file  Intimate Partner Violence: Not on file    Past Surgical History:  Procedure Laterality Date   KNEE ARTHROSCOPY Right    PROSTATE SURGERY      No family  history on file.  No Known Allergies  Current Outpatient Medications on File Prior to Visit  Medication Sig Dispense Refill   Ascorbic Acid (VITAMIN C) 100 MG tablet Take 100 mg by mouth daily.     cholecalciferol (VITAMIN D3) 25 MCG (1000 UNIT) tablet Take 1,000 Units by mouth daily.     COVID-19 At Home Antigen Test [facility] COVID-19 HOME TEST) KIT Use as directed per package instructions 4 each 0   Multiple Vitamin (MULTIVITAMIN) tablet Take 1 tablet by mouth daily.     neomycin-polymyxin-hydrocortisone (CORTISPORIN) 3.5-10000-1 OTIC suspension Instill 4 drops into affected ear three times a day for 10 days 10 mL 0   pantoprazole (PROTONIX) 40 MG tablet Take 1 tablet  (40 mg total) by mouth daily. 90 tablet 1   Red Yeast Rice 600 MG CAPS 2 capsules     zinc sulfate 220 (50 Zn) MG capsule Take 220 mg by mouth daily.     No current facility-administered medications on file prior to visit.    BP 140/80   Pulse 62   Temp 98.2 F (36.8 C)   Resp 18   Ht _0  (1.854 m)   Wt 258 lb (117 kg)   SpO2 94%   BMI 34.04 kg/m           Objective:   Physical Exam  General Mental Status- Alert. General Appearance- Not in acute distress.   Skin Scattered moles. He sees dermatologist on regular basis.  Neck Carotid Arteries- Normal color. Moisture- Normal Moisture. No carotid bruits. No JVD.  Chest and Lung Exam Auscultation: Breath Sounds:-Normal.  Cardiovascular Auscultation:Rythm- Regular. Murmurs & Other Heart Sounds:Auscultation of the heart reveals- No Murmurs.  Abdomen Inspection:-Inspeection Normal. Palpation/Percussion:Note:No mass. Palpation and Percussion of the abdomen reveal- Non Tender, Non Distended + BS, no rebound or guarding.   Neurologic Cranial Nerve exam:- CN III-XII intact(No nystagmus), symmetric smile. Strength:- 5/5 equal and symmetric strength both upper and lower extremities.       Assessment & Plan:   For you wellness exam today I have ordered cbc, cmp and lipid panel,.  Vaccine up to date. Note second shingrix done in past.  Recommend exercise and healthy diet.  We will let you know lab results as they come in.  Follow up date appointment will be determined after lab review.    Anxiety better controlled recently. Discussed continuing lexpro. If ever want to get off would be tapering process.  Elevated bp borderline over the years. Recommend low salt diet and 5-10 lb weight loss would potentially drop bp as well. Get bp cuff over the counter and check bp daily when relaxed. Would like to see close to 130/80 or less. If above 140/90 would recommend medication.  George Love, [place]    [id number] in  addition to wellness. Addressed anxiety and elevated bp as well.

## 2021-08-23 NOTE — Patient Instructions (Addendum)
For you wellness exam today I have ordered cbc, cmp and lipid panel,.  Vaccine up to date. Note second shingrix done in past.  Recommend exercise and healthy diet.  We will let you know lab results as they come in.  Follow up date appointment will be determined after lab review.    Anxiety better controlled recently. Discussed continuing lexpro. If ever want to get off would be tapering process.  Elevated bp borderline over the years. Recommend low salt diet and 5-10 lb weight loss would potentially drop bp as well. Get bp cuff over the counter and check bp daily when relaxed. Would like to see close to 130/80 or less. If above 140/90 would recommend medication.  Preventive Care 65-49 Years Old, Male Preventive care refers to lifestyle choices and visits with your health care provider that can promote health and wellness. Preventive care visits are also called wellness exams. What can I expect for my preventive care visit? Counseling During your preventive care visit, your health care provider may ask about your: Medical history, including: Past medical problems. Family medical history. Current health, including: Emotional well-being. Home life and relationship well-being. Sexual activity. Lifestyle, including: Alcohol, nicotine or tobacco, and drug use. Access to firearms. Diet, exercise, and sleep habits. Safety issues such as seatbelt and bike helmet use. Sunscreen use. Work and work Statistician. Physical exam Your health care provider will check your: Height and weight. These may be used to calculate your BMI (body mass index). BMI is a measurement that tells if you are at a healthy weight. Waist circumference. This measures the distance around your waistline. This measurement also tells if you are at a healthy weight and may help predict your risk of certain diseases, such as type 2 diabetes and high blood pressure. Heart rate and blood pressure. Body temperature. Skin for  abnormal spots. What immunizations do I need?  Vaccines are usually given at various ages, according to a schedule. Your health care provider will recommend vaccines for you based on your age, medical history, and lifestyle or other factors, such as travel or where you work. What tests do I need? Screening Your health care provider may recommend screening tests for certain conditions. This may include: Lipid and cholesterol levels. Diabetes screening. This is done by checking your blood sugar (glucose) after you have not eaten for a while (fasting). Hepatitis B test. Hepatitis C test. HIV (human immunodeficiency virus) test. STI (sexually transmitted infection) testing, if you are at risk. Lung cancer screening. Prostate cancer screening. Colorectal cancer screening. Talk with your health care provider about your test results, treatment options, and if necessary, the need for more tests. Follow these instructions at home: Eating and drinking  Eat a diet that includes fresh fruits and vegetables, whole grains, lean protein, and low-fat dairy products. Take vitamin and mineral supplements as recommended by your health care provider. Do not drink alcohol if your health care provider tells you not to drink. If you drink alcohol: Limit how much you have to 0-2 drinks a day. Know how much alcohol is in your drink. In the [place] one drink equals one 12 oz bottle of beer (355 mL), one 5 oz glass of wine (148 mL), or one 1 oz glass of hard liquor (44 mL). Lifestyle Brush your teeth every morning and night with fluoride toothpaste. Floss one time each day. Exercise for at least 30 minutes 5 or more days each week. Do not use any products that contain nicotine or tobacco.  These products include cigarettes, chewing tobacco, and vaping devices, such as e-cigarettes. If you need help quitting, ask your health care provider. Do not use drugs. If you are sexually active, practice safe sex. Use a  condom or other form of protection to prevent STIs. Take aspirin only as told by your health care provider. Make sure that you understand how much to take and what form to take. Work with your health care provider to find out whether it is safe and beneficial for you to take aspirin daily. Find healthy ways to manage stress, such as: Meditation, yoga, or listening to music. Journaling. Talking to a trusted person. Spending time with friends and family. Minimize exposure to UV radiation to reduce your risk of skin cancer. Safety Always wear your seat belt while driving or riding in a vehicle. Do not drive: If you have been drinking alcohol. Do not ride with someone who has been drinking. When you are tired or distracted. While texting. If you have been using any mind-altering substances or drugs. Wear a helmet and other protective equipment during sports activities. If you have firearms in your house, make sure you follow all gun safety procedures. What's next? Go to your health care provider once a year for an annual wellness visit. Ask your health care provider how often you should have your eyes and teeth checked. Stay up to date on all vaccines. This information is not intended to replace advice given to you by your health care provider. Make sure you discuss any questions you have with your health care provider. Document Revised: 08/03/2020 Document Reviewed: 08/03/2020 Elsevier Patient Education  [place].

## 2021-08-31 DIAGNOSIS — D1801 Hemangioma of skin and subcutaneous tissue: Secondary | ICD-10-CM | POA: Diagnosis not present

## 2021-08-31 DIAGNOSIS — D2271 Melanocytic nevi of right lower limb, including hip: Secondary | ICD-10-CM | POA: Diagnosis not present

## 2021-08-31 DIAGNOSIS — C44519 Basal cell carcinoma of skin of other part of trunk: Secondary | ICD-10-CM | POA: Diagnosis not present

## 2021-08-31 DIAGNOSIS — L821 Other seborrheic keratosis: Secondary | ICD-10-CM | POA: Diagnosis not present

## 2021-08-31 DIAGNOSIS — C44612 Basal cell carcinoma of skin of right upper limb, including shoulder: Secondary | ICD-10-CM | POA: Diagnosis not present

## 2021-08-31 DIAGNOSIS — D2262 Melanocytic nevi of left upper limb, including shoulder: Secondary | ICD-10-CM | POA: Diagnosis not present

## 2021-08-31 DIAGNOSIS — Z85828 Personal history of other malignant neoplasm of skin: Secondary | ICD-10-CM | POA: Diagnosis not present

## 2021-08-31 DIAGNOSIS — D225 Melanocytic nevi of trunk: Secondary | ICD-10-CM | POA: Diagnosis not present

## 2021-08-31 DIAGNOSIS — L814 Other melanin hyperpigmentation: Secondary | ICD-10-CM | POA: Diagnosis not present

## 2021-08-31 DIAGNOSIS — D2272 Melanocytic nevi of left lower limb, including hip: Secondary | ICD-10-CM | POA: Diagnosis not present

## 2021-08-31 DIAGNOSIS — D2261 Melanocytic nevi of right upper limb, including shoulder: Secondary | ICD-10-CM | POA: Diagnosis not present

## 2021-09-20 ENCOUNTER — Other Ambulatory Visit: Payer: Self-pay | Admitting: Medical

## 2021-09-20 ENCOUNTER — Other Ambulatory Visit (HOSPITAL_BASED_OUTPATIENT_CLINIC_OR_DEPARTMENT_OTHER): Payer: Self-pay

## 2021-09-20 ENCOUNTER — Encounter: Payer: Self-pay | Admitting: Medical

## 2021-09-20 MED ORDER — PANTOPRAZOLE SODIUM 40 MG PO TBEC
40.0000 mg | DELAYED_RELEASE_TABLET | Freq: Every day | ORAL | 1 refills | Status: DC
  Filled 2021-09-20: qty 90, 90d supply, fill #0
  Filled 2021-12-16: qty 90, 90d supply, fill #1

## 2021-09-20 MED ORDER — ESCITALOPRAM OXALATE 10 MG PO TABS
10.0000 mg | ORAL_TABLET | Freq: Every day | ORAL | 1 refills | Status: DC
  Filled 2021-09-20: qty 90, 90d supply, fill #0
  Filled 2021-12-16: qty 90, 90d supply, fill #1

## 2021-10-25 ENCOUNTER — Other Ambulatory Visit: Payer: 59

## 2021-11-01 ENCOUNTER — Ambulatory Visit (INDEPENDENT_AMBULATORY_CARE_PROVIDER_SITE_OTHER): Payer: 59 | Admitting: Urology

## 2021-11-01 ENCOUNTER — Encounter: Payer: Self-pay | Admitting: Urology

## 2021-11-01 VITALS — BP 112/67 | HR 66

## 2021-11-01 DIAGNOSIS — R972 Elevated prostate specific antigen [PSA]: Secondary | ICD-10-CM

## 2021-11-01 DIAGNOSIS — R3915 Urgency of urination: Secondary | ICD-10-CM | POA: Diagnosis not present

## 2021-11-01 DIAGNOSIS — R3914 Feeling of incomplete bladder emptying: Secondary | ICD-10-CM | POA: Insufficient documentation

## 2021-11-01 DIAGNOSIS — R351 Nocturia: Secondary | ICD-10-CM | POA: Diagnosis not present

## 2021-11-01 LAB — URINALYSIS, ROUTINE W REFLEX MICROSCOPIC
Bilirubin, UA: NEGATIVE
Glucose, UA: NEGATIVE
Ketones, UA: NEGATIVE
Nitrite, UA: NEGATIVE
Protein,UA: NEGATIVE
RBC, UA: NEGATIVE
Specific Gravity, UA: 1.02 (ref 1.005–1.030)
Urobilinogen, Ur: 0.2 mg/dL (ref 0.2–1.0)
pH, UA: 5 (ref 5.0–7.5)

## 2021-11-01 LAB — MICROSCOPIC EXAMINATION
RBC, Urine: NONE SEEN /hpf (ref 0–2)
Renal Epithel, UA: NONE SEEN /hpf

## 2021-11-01 LAB — BLADDER SCAN AMB NON-IMAGING: Scan Result: 34

## 2021-11-01 MED ORDER — MIRABEGRON ER 25 MG PO TB24: 25.0000 mg | ORAL_TABLET | Freq: Every day | ORAL | 0 refills | Status: DC

## 2021-11-01 NOTE — Patient Instructions (Signed)

## 2021-11-01 NOTE — Progress Notes (Signed)
11/01/2021 11:00 AM   George Love November 17, 1955 [id number]  Referring provider: Mackie Pai, PA-C [address] [place],  [place] [address]  Followup elevated PSA   HPI: George Love is a 66yo here for followup for elevated PSA and nocturia. PSA increased to 6.1 from 5.2. His PSA was 6.9 last year. IPSS 16 QOL 3. He has urinary urgency which bothers hims. Nocturia 2x. Urine stream strong.    PMH: Past Medical History:  Diagnosis Date   Anxiety    GERD (gastroesophageal reflux disease)    Hyperlipidemia     Surgical History: Past Surgical History:  Procedure Laterality Date   KNEE ARTHROSCOPY Right    PROSTATE SURGERY      Home Medications:  Allergies as of 11/01/2021   No Known Allergies      Medication List        Accurate as of November 01, 2021 11:00 AM. If you have any questions, ask your nurse or doctor.          Carestart COVID-19 Home Test Kit Generic drug: COVID-19 At Home Antigen Test Use as directed per package instructions   cholecalciferol 25 MCG (1000 UNIT) tablet Commonly known as: VITAMIN D3 Take 1,000 Units by mouth daily.   escitalopram 10 MG tablet Commonly known as: Lexapro Take 1 tablet (10 mg total) by mouth daily.   multivitamin tablet Take 1 tablet by mouth daily.   neomycin-polymyxin-hydrocortisone 3.5-10000-1 OTIC suspension Commonly known as: CORTISPORIN Instill 4 drops into affected ear three times a day for 10 days   pantoprazole 40 MG tablet Commonly known as: PROTONIX Take 1 tablet (40 mg total) by mouth daily.   Red Yeast Rice 600 MG Caps 2 capsules   vitamin C 100 MG tablet Take 100 mg by mouth daily.   zinc sulfate 220 (50 Zn) MG capsule Take 220 mg by mouth daily.        Allergies: No Known Allergies  Family History: No family history on file.  Social History:  reports that he quit smoking about 8 years ago. His smoking use included cigarettes. He has a 20.00 pack-year smoking  history. He has never used smokeless tobacco. He reports current alcohol use. He reports that he does not use drugs.  ROS: All other review of systems were reviewed and are negative except what is noted above in HPI  Physical Exam: BP 112/67   Pulse 66   Constitutional:  Alert and oriented, No acute distress. HEENT: [place] AT, moist mucus membranes.  Trachea midline, no masses. Cardiovascular: No clubbing, cyanosis, or edema. Respiratory: Normal respiratory effort, no increased work of breathing. GI: Abdomen is soft, nontender, nondistended, no abdominal masses GU: No CVA tenderness.  Lymph: No cervical or inguinal lymphadenopathy. Skin: No rashes, bruises or suspicious lesions. Neurologic: Grossly intact, no focal deficits, moving all 4 extremities. Psychiatric: Normal mood and affect.  Laboratory Data: Lab Results  Component Value Date   WBC 6.1 08/23/2021   HGB 13.6 08/23/2021   HCT 40.3 08/23/2021   MCV 90.2 08/23/2021   PLT 207.0 08/23/2021    Lab Results  Component Value Date   CREATININE 0.89 08/23/2021    Lab Results  Component Value Date   PSA 6.18 (H) 08/23/2021    No results found for: "TESTOSTERONE"  No results found for: "HGBA1C"  Urinalysis    Component Value Date/Time   APPEARANCEUR Clear 04/29/2020 0950   GLUCOSEU Negative 04/29/2020 [place] Negative 04/29/2020 0950  PROTEINUR Negative 04/29/2020 0950   NITRITE Negative 04/29/2020 0950   LEUKOCYTESUR Negative 04/29/2020 0950    Lab Results  Component Value Date   LABMICR Comment 04/29/2020    Pertinent Imaging:  No results found for this or any previous visit.  No results found for this or any previous visit.  No results found for this or any previous visit.  No results found for this or any previous visit.  No results found for this or any previous visit.  No results found for this or any previous visit.  No results found for this or any previous visit.  No results  found for this or any previous visit.   Assessment & Plan:    1. Elevated PSA RTC 6 months PSA - Urinalysis, Routine w reflex microscopic  2. Urinary Urgency We mirabegron 37m daily - BLADDER SCAN AMB NON-IMAGING  3. Nocturia -We will trial mirabegron 289mdaily   No follow-ups on file.  PaNicolette BangMD  [facility] [place]

## 2021-11-01 NOTE — Progress Notes (Signed)
post void residual=34 ?

## 2021-12-18 ENCOUNTER — Other Ambulatory Visit (HOSPITAL_BASED_OUTPATIENT_CLINIC_OR_DEPARTMENT_OTHER): Payer: Self-pay

## 2022-04-02 ENCOUNTER — Ambulatory Visit (INDEPENDENT_AMBULATORY_CARE_PROVIDER_SITE_OTHER): Payer: 59

## 2022-04-02 ENCOUNTER — Ambulatory Visit (HOSPITAL_COMMUNITY)
Admission: EM | Admit: 2022-04-02 | Discharge: 2022-04-02 | Disposition: A | Discharge status: Home / Self Care | Payer: 59 | Attending: Internal Medicine | Admitting: Internal Medicine

## 2022-04-02 ENCOUNTER — Other Ambulatory Visit (HOSPITAL_BASED_OUTPATIENT_CLINIC_OR_DEPARTMENT_OTHER): Payer: Self-pay

## 2022-04-02 ENCOUNTER — Other Ambulatory Visit: Payer: Self-pay | Admitting: Medical

## 2022-04-02 ENCOUNTER — Encounter (HOSPITAL_COMMUNITY): Payer: Self-pay | Admitting: *Deleted

## 2022-04-02 ENCOUNTER — Other Ambulatory Visit: Payer: Self-pay

## 2022-04-02 DIAGNOSIS — J029 Acute pharyngitis, unspecified: Secondary | ICD-10-CM

## 2022-04-02 DIAGNOSIS — J208 Acute bronchitis due to other specified organisms: Secondary | ICD-10-CM | POA: Insufficient documentation

## 2022-04-02 DIAGNOSIS — R059 Cough, unspecified: Secondary | ICD-10-CM | POA: Diagnosis not present

## 2022-04-02 DIAGNOSIS — Z1152 Encounter for screening for COVID-19: Secondary | ICD-10-CM | POA: Diagnosis not present

## 2022-04-02 DIAGNOSIS — H5789 Other specified disorders of eye and adnexa: Secondary | ICD-10-CM | POA: Insufficient documentation

## 2022-04-02 DIAGNOSIS — Z87891 Personal history of nicotine dependence: Secondary | ICD-10-CM | POA: Insufficient documentation

## 2022-04-02 MED ORDER — ESCITALOPRAM OXALATE 10 MG PO TABS
10.0000 mg | ORAL_TABLET | Freq: Every day | ORAL | 1 refills | Status: DC
  Filled 2022-04-02: qty 90, 90d supply, fill #0
  Filled 2022-07-23: qty 90, 90d supply, fill #1

## 2022-04-02 MED ORDER — FLUTICASONE PROPIONATE 50 MCG/ACT NA SUSP
1.0000 | Freq: Every day | NASAL | 0 refills | Status: DC
  Filled 2022-04-02: qty 16, 30d supply, fill #0

## 2022-04-02 MED ORDER — GUAIFENESIN ER 600 MG PO TB12
600.0000 mg | ORAL_TABLET | Freq: Two times a day (BID) | ORAL | 0 refills | Status: DC
  Filled 2022-04-02: qty 20, 10d supply, fill #0

## 2022-04-02 MED ORDER — PANTOPRAZOLE SODIUM 40 MG PO TBEC
40.0000 mg | DELAYED_RELEASE_TABLET | Freq: Every day | ORAL | 1 refills | Status: DC
  Filled 2022-04-02: qty 90, 90d supply, fill #0
  Filled 2022-07-23: qty 90, 90d supply, fill #1

## 2022-04-02 MED ORDER — BENZONATATE 100 MG PO CAPS
100.0000 mg | ORAL_CAPSULE | Freq: Three times a day (TID) | ORAL | 0 refills | Status: DC | PRN
  Filled 2022-04-02: qty 21, 7d supply, fill #0

## 2022-04-02 MED ORDER — ERYTHROMYCIN 5 MG/GM OP OINT
TOPICAL_OINTMENT | Freq: Every day | OPHTHALMIC | 0 refills | Status: AC
  Filled 2022-04-02: qty 3.5, 5d supply, fill #0

## 2022-04-02 NOTE — ED Provider Notes (Signed)
[place]    CSN: [id number] Arrival date & time: 04/02/22  0849      History   Chief Complaint Chief Complaint  Patient presents with   Sore Throat   Cough   Nasal Congestion    HPI George Love is a 67 y.o. male comes to the urgent care with 3 to 4-day history of sore throat, nonproductive cough and nasal congestion.  Patient has tried over-the-counter medications with no improvement in symptoms.  He has some difficulty breathing on exertion.  He denies any chest tightness or wheezing.  Patient has had a low-grade fever at the outset of his symptoms.  No dizziness, nausea, vomiting or diarrhea.  [place]  He denies any sick contacts but there were several people at the fair.  He denies any ear pain.  He also complains of bilateral eye redness with yellowish discharge.  He has nasal discharge which is yellowish in color.  He has nasal congestion as well.  He is complaining of postnasal drainage.  No itchy nose or eyes.  Patient is fully vaccinated against COVID-19 virus.  No prior COVID-19 infection.   HPI  Past Medical History:  Diagnosis Date   Anxiety    GERD (gastroesophageal reflux disease)    Hyperlipidemia     Patient Active Problem List   Diagnosis Date Noted   urinary urgency 11/01/2021   Elevated PSA 10/28/2019    Past Surgical History:  Procedure Laterality Date   KNEE ARTHROSCOPY Right    PROSTATE SURGERY         Home Medications    Prior to Admission medications   Medication Sig Start Date End Date Taking? Authorizing Provider  benzonatate (TESSALON) 100 MG capsule Take 1 capsule (100 mg total) by mouth 3 (three) times daily as needed for cough. 04/02/22  Yes Neidy Guerrieri, Myrene Galas, MD  erythromycin ophthalmic ointment Place into both eyes at bedtime for 5 days. Place a 1/2 inch ribbon of ointment into the lower eyelid. 04/02/22 04/07/22 Yes Evie Crumpler, Myrene Galas, MD  fluticasone (FLONASE) 50 MCG/ACT nasal spray Place 1 spray into  both nostrils daily. 04/02/22  Yes Betzaida Cremeens, Myrene Galas, MD  guaiFENesin (MUCINEX) 600 MG 12 hr tablet Take 1 tablet (600 mg total) by mouth 2 (two) times daily. 04/02/22  Yes Drevion Offord, Myrene Galas, MD  Ascorbic Acid (VITAMIN C) 100 MG tablet Take 100 mg by mouth daily.    [provider]  cholecalciferol (VITAMIN D3) 25 MCG (1000 UNIT) tablet Take 1,000 Units by mouth daily.    [provider]  COVID-19 At Home Antigen Test [facility] COVID-19 HOME TEST) KIT Use as directed per package instructions 08/12/20   Clementeen Graham, RPH  escitalopram (LEXAPRO) 10 MG tablet Take 1 tablet (10 mg total) by mouth daily. 09/20/21   Saguier, Percell Miller, PA-C  mirabegron ER (MYRBETRIQ) 25 MG TB24 tablet Take 1 tablet (25 mg total) by mouth daily. 11/01/21   McKenzie, Candee Furbish, MD  Multiple Vitamin (MULTIVITAMIN) tablet Take 1 tablet by mouth daily.    [provider]  neomycin-polymyxin-hydrocortisone (CORTISPORIN) 3.5-10000-1 OTIC suspension Instill 4 drops into affected ear three times a day for 10 days 04/14/21     pantoprazole (PROTONIX) 40 MG tablet Take 1 tablet (40 mg total) by mouth daily. 09/20/21   Saguier, Percell Miller, PA-C  Red Yeast Rice 600 MG CAPS 2 capsules    [provider]  zinc sulfate 220 (50 Zn) MG capsule Take 220 mg by  mouth daily.    [provider]    Family History History reviewed. No pertinent family history.  Social History Social History   Tobacco Use   Smoking status: Former    Packs/day: 1.00    Years: 20.00    Total pack years: 20.00    Types: Cigarettes    Quit date: 10/27/2013    Years since quitting: 8.4   Smokeless tobacco: Never  Vaping Use   Vaping Use: Never used  Substance Use Topics   Alcohol use: Yes    Comment: socially. 3-4 spready out over a week.   Drug use: Never     Allergies   Patient has no known allergies.   Review of Systems Review of Systems As per HPI  Physical Exam Triage Vital Signs ED Triage Vitals   Enc Vitals Group     BP 04/02/22 0952 (!) 129/91     Pulse Rate 04/02/22 0952 76     Resp 04/02/22 0952 18     Temp 04/02/22 0952 98.3 F (36.8 C)     Temp src --      SpO2 04/02/22 0952 94 %     Weight --      Height --      Head Circumference --      Peak Flow --      Pain Score 04/02/22 0950 0     Pain Loc --      Pain Edu? --      Excl. in [place]? --    No data found.  Updated Vital Signs BP (!) 129/91   Pulse 76   Temp 98.3 F (36.8 C)   Resp 18   SpO2 94%   Visual Acuity Right Eye Distance:   Left Eye Distance:   Bilateral Distance:    Right Eye Near:   Left Eye Near:    Bilateral Near:     Physical Exam Vitals and nursing note reviewed.  Constitutional:      General: He is not in acute distress.    Appearance: He is ill-appearing.  HENT:     Right Ear: Tympanic membrane normal.     Left Ear: Tympanic membrane normal.     Ears:     Comments: Bilateral conjunctivitis    Mouth/Throat:     Mouth: Mucous membranes are moist. No oral lesions.     Pharynx: Posterior oropharyngeal erythema present. No oropharyngeal exudate.     Tonsils: No tonsillar exudate or tonsillar abscesses.  Cardiovascular:     Rate and Rhythm: Normal rate and regular rhythm.  Pulmonary:     Effort: Pulmonary effort is normal.     Breath sounds: Normal breath sounds.  Neurological:     Mental Status: He is alert.      UC Treatments / Results  Labs (all labs ordered are listed, but only abnormal results are displayed) Labs Reviewed  SARS CORONAVIRUS 2 (TAT 6-24 HRS)    EKG   Radiology DG Chest 2 View  Result Date: 04/02/2022 CLINICAL DATA:  Productive cough, sore throat. EXAM: CHEST - 2 VIEW COMPARISON:  12/17/2011. FINDINGS: Trachea is midline. Heart size normal. Minimal scarring in the left lung base. Lungs are otherwise clear. No pleural fluid. IMPRESSION: No acute findings. Electronically Signed   By: Lorin Picket M.D.   On: 04/02/2022 10:51     Procedures Procedures (including critical care time)  Medications Ordered in UC Medications - No data to display  Initial Impression / Assessment and  Plan / UC Course  I have reviewed the triage vital signs and the nursing notes.  Pertinent labs & imaging results that were available during my care of the patient were reviewed by me and considered in my medical decision making (see chart for details).     1.  Acute viral pharyngoconjunctivitis: Increase oral fluid intake Chest x-ray is negative for acute lung infiltrate Tylenol/Motrin as needed for pain and/or fever COVID-19 PCR test has been sent Tessalon Perles as needed for cough Mucinex twice daily Erythromycin eye ointment Fluticasone nasal spray Humidifier and VapoRub use recommended Return precautions given.  Final Clinical Impressions(s) / UC Diagnoses   Final diagnoses:  Acute viral bronchitis     Discharge Instructions      Please increase oral fluid intake Please take medications as prescribed Will call you with recommendations if labs are abnormal If your COVID test is positive we will send Paxlovid to the pharmacy for you to pick up Please return to urgent care if you have worsening symptoms.     ED Prescriptions     Medication Sig Dispense Auth. Provider   benzonatate (TESSALON) 100 MG capsule Take 1 capsule (100 mg total) by mouth 3 (three) times daily as needed for cough. 21 capsule Garnette Greb, Myrene Galas, MD   guaiFENesin (MUCINEX) 600 MG 12 hr tablet Take 1 tablet (600 mg total) by mouth 2 (two) times daily. 20 tablet Asiya Cutbirth, Myrene Galas, MD   erythromycin ophthalmic ointment Place into both eyes at bedtime for 5 days. Place a 1/2 inch ribbon of ointment into the lower eyelid. 3.5 g Chase Picket, MD   fluticasone [facility]) 50 MCG/ACT nasal spray Place 1 spray into both nostrils daily. 16 g Eustace Hur, Myrene Galas, MD      PDMP not reviewed this encounter.   Chase Picket, MD 04/02/22 1135

## 2022-04-02 NOTE — ED Triage Notes (Signed)
Pt reports having a cough ,sore throat,productive cough Sx's starting on Thursday.

## 2022-04-02 NOTE — Discharge Instructions (Addendum)
Please increase oral fluid intake Please take medications as prescribed Will call you with recommendations if labs are abnormal If your COVID test is positive we will send Paxlovid to the pharmacy for you to pick up Please return to urgent care if you have worsening symptoms.

## 2022-04-03 LAB — SARS CORONAVIRUS 2 (TAT 6-24 HRS): SARS Coronavirus 2: NEGATIVE

## 2022-04-27 ENCOUNTER — Other Ambulatory Visit: Payer: 59

## 2022-04-27 DIAGNOSIS — R972 Elevated prostate specific antigen [PSA]: Secondary | ICD-10-CM | POA: Diagnosis not present

## 2022-04-28 LAB — PSA, TOTAL AND FREE
PSA, Free Pct: 22.6 %
PSA, Free: 1.92 ng/mL
Prostate Specific Ag, Serum: 8.5 ng/mL — ABNORMAL HIGH (ref 0.0–4.0)

## 2022-05-04 ENCOUNTER — Encounter: Payer: Self-pay | Admitting: Urology

## 2022-05-04 ENCOUNTER — Ambulatory Visit (INDEPENDENT_AMBULATORY_CARE_PROVIDER_SITE_OTHER): Payer: 59 | Admitting: Urology

## 2022-05-04 VITALS — BP 121/68 | HR 67

## 2022-05-04 DIAGNOSIS — R3915 Urgency of urination: Secondary | ICD-10-CM | POA: Diagnosis not present

## 2022-05-04 DIAGNOSIS — R351 Nocturia: Secondary | ICD-10-CM | POA: Diagnosis not present

## 2022-05-04 DIAGNOSIS — R972 Elevated prostate specific antigen [PSA]: Secondary | ICD-10-CM | POA: Diagnosis not present

## 2022-05-04 LAB — URINALYSIS, ROUTINE W REFLEX MICROSCOPIC
Bilirubin, UA: NEGATIVE
Glucose, UA: NEGATIVE
Ketones, UA: NEGATIVE
Leukocytes,UA: NEGATIVE
Nitrite, UA: NEGATIVE
Protein,UA: NEGATIVE
RBC, UA: NEGATIVE
Specific Gravity, UA: 1.01 (ref 1.005–1.030)
Urobilinogen, Ur: 0.2 mg/dL (ref 0.2–1.0)
pH, UA: 5.5 (ref 5.0–7.5)

## 2022-05-04 MED ORDER — GEMTESA 75 MG PO TABS: 1.0000 | ORAL_TABLET | Freq: Every day | ORAL | 0 refills | Status: DC

## 2022-05-04 NOTE — Patient Instructions (Signed)

## 2022-05-04 NOTE — Progress Notes (Signed)
05/04/2022 9:42 AM   George Love 1955-03-02 [id number]  Referring provider: Mackie Pai, PA-C [address] [place],  [place] [address]  Followup elevated PSA and BPH   HPI: [name] is a 67yo here for followup for elevated PSA and BPH with nocturia. PSA increased to 8.5 from 5.2 IPSS 15 QOL 3. He tried mirabegron which failed to imporve his LUTS. Nocturia 2-3x. Urine stream strong. No straining to urinate. No other complaints today   PMH: Past Medical History:  Diagnosis Date   Anxiety    GERD (gastroesophageal reflux disease)    Hyperlipidemia     Surgical History: Past Surgical History:  Procedure Laterality Date   KNEE ARTHROSCOPY Right    PROSTATE SURGERY      Home Medications:  Allergies as of 05/04/2022   No Known Allergies      Medication List        Accurate as of May 04, 2022  9:42 AM. If you have any questions, ask your nurse or doctor.          benzonatate 100 MG capsule Commonly known as: TESSALON Take 1 capsule (100 mg total) by mouth 3 (three) times daily as needed for cough.   Carestart COVID-19 Home Test Kit Generic drug: COVID-19 At Home Antigen Test Use as directed per package instructions   cholecalciferol 25 MCG (1000 UNIT) tablet Commonly known as: VITAMIN D3 Take 1,000 Units by mouth daily.   escitalopram 10 MG tablet Commonly known as: Lexapro Take 1 tablet (10 mg total) by mouth daily.   fluticasone 50 MCG/ACT nasal spray Commonly known as: FLONASE Place 1 spray into both nostrils daily.   FT Mucus Relief 12HR 600 MG 12 hr tablet Generic drug: guaiFENesin Take 1 tablet (600 mg total) by mouth 2 (two) times daily.   mirabegron ER 25 MG Tb24 tablet Commonly known as: MYRBETRIQ Take 1 tablet (25 mg total) by mouth daily.   multivitamin tablet Take 1 tablet by mouth daily.   neomycin-polymyxin-hydrocortisone 3.5-10000-1 OTIC suspension Commonly known as: CORTISPORIN Instill 4 drops into affected  ear three times a day for 10 days   pantoprazole 40 MG tablet Commonly known as: PROTONIX Take 1 tablet (40 mg total) by mouth daily.   Red Yeast Rice 600 MG Caps 2 capsules   vitamin C 100 MG tablet Take 100 mg by mouth daily.   zinc sulfate 220 (50 Zn) MG capsule Take 220 mg by mouth daily.        Allergies: No Known Allergies  Family History: No family history on file.  Social History:  reports that he quit smoking about 8 years ago. His smoking use included cigarettes. He has a 20.00 pack-year smoking history. He has never used smokeless tobacco. He reports current alcohol use. He reports that he does not use drugs.  ROS: All other review of systems were reviewed and are negative except what is noted above in HPI  Physical Exam: BP 121/68   Pulse 67   Constitutional:  Alert and oriented, No acute distress. HEENT: [place] AT, moist mucus membranes.  Trachea midline, no masses. Cardiovascular: No clubbing, cyanosis, or edema. Respiratory: Normal respiratory effort, no increased work of breathing. GI: Abdomen is soft, nontender, nondistended, no abdominal masses GU: No CVA tenderness.  Lymph: No cervical or inguinal lymphadenopathy. Skin: No rashes, bruises or suspicious lesions. Neurologic: Grossly intact, no focal deficits, moving all 4 extremities. Psychiatric: Normal mood and affect.  Laboratory Data: Lab  Results  Component Value Date   WBC 6.1 08/23/2021   HGB 13.6 08/23/2021   HCT 40.3 08/23/2021   MCV 90.2 08/23/2021   PLT 207.0 08/23/2021    Lab Results  Component Value Date   CREATININE 0.89 08/23/2021    Lab Results  Component Value Date   PSA 6.18 (H) 08/23/2021    No results found for: "TESTOSTERONE"  No results found for: "HGBA1C"  Urinalysis    Component Value Date/Time   APPEARANCEUR Clear 11/01/2021 1109   GLUCOSEU Negative 11/01/2021 1109   BILIRUBINUR Negative 11/01/2021 1109   PROTEINUR Negative 11/01/2021 1109   NITRITE  Negative 11/01/2021 1109   LEUKOCYTESUR 1+ (A) 11/01/2021 1109    Lab Results  Component Value Date   LABMICR See below: 11/01/2021   WBCUA 6-10 (A) 11/01/2021   LABEPIT 0-10 11/01/2021   BACTERIA Few (A) 11/01/2021    Pertinent Imaging:  No results found for this or any previous visit.  No results found for this or any previous visit.  No results found for this or any previous visit.  No results found for this or any previous visit.  No results found for this or any previous visit.  No valid procedures specified. No results found for this or any previous visit.  No results found for this or any previous visit.   Assessment & Plan:    1. Elevated PSA -recheck PSA. If his PSA is persistently elevated  - Urinalysis, Routine w reflex microscopic  2. Urinary urgency -Gemtesa 75mg  daily  3. Nocturia Gemtesa 75mg  daily   No follow-ups on file.  Nicolette Bang, MD  [facility] [place]

## 2022-05-05 LAB — PSA: Prostate Specific Ag, Serum: 7.8 ng/mL — ABNORMAL HIGH (ref 0.0–4.0)

## 2022-05-10 ENCOUNTER — Telehealth: Payer: Self-pay

## 2022-05-10 NOTE — Telephone Encounter (Signed)
-----   Message from Cleon Gustin, MD sent at 05/08/2022  8:24 AM EDT ----- PSA still elevated. He heeds to be scheduled for prostate biopsy ----- Message ----- From: Iris Pert, LPN Sent: [id number]   9:00 AM EDT To: Cleon Gustin, MD  Please review

## 2022-05-10 NOTE — Telephone Encounter (Signed)
Patient called with no answer. Detailed message left. Requested call back to office.

## 2022-05-14 ENCOUNTER — Other Ambulatory Visit (HOSPITAL_BASED_OUTPATIENT_CLINIC_OR_DEPARTMENT_OTHER): Payer: Self-pay

## 2022-05-15 NOTE — Telephone Encounter (Signed)
Made patient aware of MD's recommendations.  Patient states he would like to hold off on doing a prostate biopsy right now.  Patient states his PSA did come down some and would like to have one more PSA done before moving forward with the prostate biopsy.

## 2022-05-16 ENCOUNTER — Other Ambulatory Visit (HOSPITAL_BASED_OUTPATIENT_CLINIC_OR_DEPARTMENT_OTHER): Payer: Self-pay

## 2022-05-16 MED ORDER — PEG 3350-KCL-NABCB-NACL-NASULF 236 G PO SOLR
ORAL | 0 refills | Status: DC
  Filled 2022-05-16: qty 4000, 1d supply, fill #0

## 2022-05-21 DIAGNOSIS — K219 Gastro-esophageal reflux disease without esophagitis: Secondary | ICD-10-CM | POA: Diagnosis not present

## 2022-05-21 DIAGNOSIS — K649 Unspecified hemorrhoids: Secondary | ICD-10-CM | POA: Diagnosis not present

## 2022-05-21 DIAGNOSIS — K449 Diaphragmatic hernia without obstruction or gangrene: Secondary | ICD-10-CM | POA: Diagnosis not present

## 2022-05-21 DIAGNOSIS — K5730 Diverticulosis of large intestine without perforation or abscess without bleeding: Secondary | ICD-10-CM | POA: Diagnosis not present

## 2022-05-21 DIAGNOSIS — K317 Polyp of stomach and duodenum: Secondary | ICD-10-CM | POA: Diagnosis not present

## 2022-05-21 DIAGNOSIS — K635 Polyp of colon: Secondary | ICD-10-CM | POA: Diagnosis not present

## 2022-05-21 DIAGNOSIS — Z09 Encounter for follow-up examination after completed treatment for conditions other than malignant neoplasm: Secondary | ICD-10-CM | POA: Diagnosis not present

## 2022-05-21 DIAGNOSIS — Z86010 Personal history of colonic polyps: Secondary | ICD-10-CM | POA: Diagnosis not present

## 2022-05-21 LAB — HM COLONOSCOPY

## 2022-05-28 ENCOUNTER — Telehealth: Payer: Self-pay

## 2022-05-28 NOTE — Telephone Encounter (Signed)
Patient called wanting to give you an update regarding the recent mediation that was prescribed at his last appointment. He noticed a significant change with taking the medication. He advised it is "working great".   He is also requesting another PSA prior to having any procedure. If you place the order I will contact the patient and schedule an appointment for a lab draw.    Thank you

## 2022-07-23 ENCOUNTER — Other Ambulatory Visit (HOSPITAL_BASED_OUTPATIENT_CLINIC_OR_DEPARTMENT_OTHER): Payer: Self-pay

## 2022-07-25 ENCOUNTER — Other Ambulatory Visit: Payer: Self-pay | Admitting: Gastroenterology

## 2022-07-25 DIAGNOSIS — K317 Polyp of stomach and duodenum: Secondary | ICD-10-CM | POA: Diagnosis not present

## 2022-08-14 ENCOUNTER — Ambulatory Visit (HOSPITAL_COMMUNITY): Payer: 59 | Admitting: Certified Registered"

## 2022-08-14 ENCOUNTER — Encounter (HOSPITAL_COMMUNITY): Payer: Self-pay | Admitting: Gastroenterology

## 2022-08-14 ENCOUNTER — Other Ambulatory Visit: Payer: Self-pay

## 2022-08-14 ENCOUNTER — Ambulatory Visit (HOSPITAL_COMMUNITY)
Admission: RE | Admit: 2022-08-14 | Discharge: 2022-08-14 | Disposition: A | Discharge status: Home / Self Care | Payer: 59 | Attending: Gastroenterology | Admitting: Gastroenterology

## 2022-08-14 ENCOUNTER — Encounter (HOSPITAL_COMMUNITY)
Admission: RE | Disposition: A | Discharge status: Home / Self Care | Payer: Self-pay | Source: Home / Self Care | Attending: Gastroenterology

## 2022-08-14 ENCOUNTER — Ambulatory Visit (HOSPITAL_BASED_OUTPATIENT_CLINIC_OR_DEPARTMENT_OTHER): Payer: 59 | Admitting: Certified Registered"

## 2022-08-14 DIAGNOSIS — K449 Diaphragmatic hernia without obstruction or gangrene: Secondary | ICD-10-CM | POA: Diagnosis not present

## 2022-08-14 DIAGNOSIS — Z87891 Personal history of nicotine dependence: Secondary | ICD-10-CM | POA: Diagnosis not present

## 2022-08-14 DIAGNOSIS — K317 Polyp of stomach and duodenum: Secondary | ICD-10-CM | POA: Diagnosis not present

## 2022-08-14 DIAGNOSIS — K219 Gastro-esophageal reflux disease without esophagitis: Secondary | ICD-10-CM | POA: Insufficient documentation

## 2022-08-14 DIAGNOSIS — D132 Benign neoplasm of duodenum: Secondary | ICD-10-CM | POA: Diagnosis not present

## 2022-08-14 HISTORY — PX: ESOPHAGOGASTRODUODENOSCOPY (EGD) WITH PROPOFOL: SHX5813

## 2022-08-14 HISTORY — PX: POLYPECTOMY: SHX5525

## 2022-08-14 SURGERY — ESOPHAGOGASTRODUODENOSCOPY (EGD) WITH PROPOFOL
Anesthesia: Monitor Anesthesia Care

## 2022-08-14 MED ORDER — PROPOFOL 500 MG/50ML IV EMUL
INTRAVENOUS | Status: DC | PRN
  Administered 2022-08-14: 150 ug/kg/min via INTRAVENOUS

## 2022-08-14 MED ORDER — PROPOFOL 500 MG/50ML IV EMUL
INTRAVENOUS | Status: AC
  Filled 2022-08-14: qty 50

## 2022-08-14 MED ORDER — PROPOFOL 10 MG/ML IV BOLUS
INTRAVENOUS | Status: DC | PRN
  Administered 2022-08-14: 20 mg via INTRAVENOUS

## 2022-08-14 MED ORDER — LACTATED RINGERS IV SOLN
INTRAVENOUS | Status: DC
  Administered 2022-08-14: 1000 mL via INTRAVENOUS

## 2022-08-14 MED ORDER — PROPOFOL 1000 MG/100ML IV EMUL
INTRAVENOUS | Status: AC
  Filled 2022-08-14: qty 100

## 2022-08-14 MED ORDER — LIDOCAINE 2% (20 MG/ML) 5 ML SYRINGE
INTRAMUSCULAR | Status: DC | PRN
  Administered 2022-08-14: 100 mg via INTRAVENOUS

## 2022-08-14 MED ORDER — SODIUM CHLORIDE 0.9 % IV SOLN: INTRAVENOUS | Status: DC

## 2022-08-14 SURGICAL SUPPLY — 15 items

## 2022-08-14 NOTE — Anesthesia Procedure Notes (Signed)
Procedure Name: MAC Date/Time: 08/14/2022 11:43 AM  Performed by: Sindy Guadeloupe, CRNAPre-anesthesia Checklist: Patient identified, Emergency Drugs available, Suction available, Patient being monitored and Timeout performed Oxygen Delivery Method: Simple face mask Placement Confirmation: positive ETCO2

## 2022-08-14 NOTE — Progress Notes (Signed)
George Love 11:15 AM  Subjective: Patient doing well without any problems since we saw him recently in the office and we answered all his questions and we discussed the procedure and he is not on any aspirin or blood thinners  Objective: Vital signs stable afebrile no acute distress exam please see preassessment evaluation  Assessment: Duodenal polyp  Plan: Okay to proceed with endoscopy and polypectomy with anesthesia assistance  [facility]  office [phone] After 5PM or if no answer call [phone]

## 2022-08-14 NOTE — Transfer of Care (Signed)
Immediate Anesthesia Transfer of Care Note  Patient: George Love  Procedure(s) Performed: ESOPHAGOGASTRODUODENOSCOPY (EGD) WITH PROPOFOL POLYPECTOMY  Patient Location: PACU and Endoscopy Unit  Anesthesia Type:MAC  Level of Consciousness: awake, alert , and patient cooperative  Airway & Oxygen Therapy: Patient Spontanous Breathing and Patient connected to face mask oxygen  Post-op Assessment: Report given to RN and Post -op Vital signs reviewed and stable  Post vital signs: Reviewed and stable  Last Vitals:  Vitals Value Taken Time  BP 139/76   Temp    Pulse 66   Resp 15   SpO2 100     Last Pain:  Vitals:   08/14/22 1048  TempSrc: Temporal  PainSc: 0-No pain         Complications: No notable events documented.

## 2022-08-14 NOTE — Progress Notes (Signed)
PROCEDURE NOTE  08/14/2022  12:22 PM  PATIENT:  George Love  67 y.o. male  PRE-OPERATIVE DIAGNOSIS: Duodenal polyp  POST-OPERATIVE DIAGNOSIS: Same  PROCEDURE: EGD with snare polypectomy  SURGEON:  Surgeon(s): Vida Rigger, MD  ASSESSMENT/FINDINGS: #1 small hiatal hernia #2 multiple gastric polyps fundic gland polyps on previous pathology #3 small to medium semipedunculated second portion of the duodenum polyp status post hot snare x 3 with settings of 2 and 15 and the first and third piece recovered but the second piece not in no signs of bleeding afterwards and only small ulcer at the base of the polyp #4 otherwise within normal limits EGD  PLAN OF CARE: Await pathology will recommend 2 weeks of over-the-counter Prilosec if not already on and avoid aspirin and nonsteroidals and we will decide the timing of repeat endoscopy based on pathology

## 2022-08-14 NOTE — Anesthesia Postprocedure Evaluation (Signed)
Anesthesia Post Note  Patient: George Love  Procedure(s) Performed: ESOPHAGOGASTRODUODENOSCOPY (EGD) WITH PROPOFOL POLYPECTOMY     Patient location during evaluation: PACU Anesthesia Type: MAC Level of consciousness: awake and alert Pain management: pain level controlled Vital Signs Assessment: post-procedure vital signs reviewed and stable Respiratory status: spontaneous breathing, nonlabored ventilation and respiratory function stable Cardiovascular status: blood pressure returned to baseline and stable Postop Assessment: no apparent nausea or vomiting Anesthetic complications: no   No notable events documented.  Last Vitals:  Vitals:   08/14/22 1220 08/14/22 1236  BP: 131/78 (!) 151/93  Pulse: 63 (!) 59  Resp: 10 (!) 22  Temp:    SpO2: 97% 97%    Last Pain:  Vitals:   08/14/22 1220  TempSrc:   PainSc: 0-No pain                 Lannie Fields

## 2022-08-14 NOTE — Discharge Instructions (Addendum)
YOU HAD AN ENDOSCOPIC PROCEDURE TODAY: Refer to the procedure report and other information in the discharge instructions given to you for any specific questions about what was found during the examination. If this information does not answer your questions, please call [facility] office at [phone] to clarify.   YOU SHOULD EXPECT: Some feelings of bloating in the abdomen. Passage of more gas than usual. Walking can help get rid of the air that was put into your GI tract during the procedure and reduce the bloating. If you had a lower endoscopy (such as a colonoscopy or flexible sigmoidoscopy) you may notice spotting of blood in your stool or on the toilet paper. Some abdominal soreness may be present for a day or two, also.  No aspirin or arthritis pills for 2 weeks just Tylenol  DIET: Your first meal following the procedure should be a a heavy liquids like a milkshake and then have a light meal and then it is ok to progress to your normal diet. A half-sandwich or bowl of soup is an example of a good first meal. Heavy or fried foods are harder to digest and may make you feel nauseous or bloated. Drink plenty of fluids but you should avoid alcoholic beverages for 24 hours. If you had a esophageal dilation, please see attached instructions for diet.    ACTIVITY: Your care partner should take you home directly after the procedure. You should plan to take it easy, moving slowly for the rest of the day. You can resume normal activity the day after the procedure however YOU SHOULD NOT DRIVE, use power tools, machinery or perform tasks that involve climbing or major physical exertion for 24 hours (because of the sedation medicines used during the test).   SYMPTOMS TO REPORT IMMEDIATELY: A gastroenterologist can be reached at any hour. Please call [phone]  for any of the following symptoms:  Following lower endoscopy (colonoscopy, flexible sigmoidoscopy) Excessive amounts of blood in the stool   Significant tenderness, worsening of abdominal pains  Swelling of the abdomen that is new, acute  Fever of 100 or higher  Following upper endoscopy (EGD, EUS, ERCP, esophageal dilation) Vomiting of blood or coffee ground material  New, significant abdominal pain  New, significant chest pain or pain under the shoulder blades  Painful or persistently difficult swallowing  New shortness of breath  Black, tarry-looking or red, bloody stools  FOLLOW UP:  If any biopsies were taken you will be contacted by phone or by letter within the next 1-3 weeks. Call [phone]  if you have not heard about the biopsies in 3 weeks.  Please also call with any specific questions about appointments or follow up tests. YOU HAD AN ENDOSCOPIC PROCEDURE TODAY: Refer to the procedure report and other information in the discharge instructions given to you for any specific questions about what was found during the examination. If this information does not answer your questions, please call [facility] office at [phone] to clarify.   YOU SHOULD EXPECT: Some feelings of bloating in the abdomen. Passage of more gas than usual. Walking can help get rid of the air that was put into your GI tract during the procedure and reduce the bloating. If you had a lower endoscopy (such as a colonoscopy or flexible sigmoidoscopy) you may notice spotting of blood in your stool or on the toilet paper. Some abdominal soreness may be present for a day or two, also.  DIET: Your first meal following the procedure should be a  light meal and then it is ok to progress to your normal diet. A half-sandwich or bowl of soup is an example of a good first meal. Heavy or fried foods are harder to digest and may make you feel nauseous or bloated. Drink plenty of fluids but you should avoid alcoholic beverages for 24 hours. If you had a esophageal dilation, please see attached instructions for diet.    ACTIVITY: Your care partner should take you home  directly after the procedure. You should plan to take it easy, moving slowly for the rest of the day. You can resume normal activity the day after the procedure however YOU SHOULD NOT DRIVE, use power tools, machinery or perform tasks that involve climbing or major physical exertion for 24 hours (because of the sedation medicines used during the test).   SYMPTOMS TO REPORT IMMEDIATELY: A gastroenterologist can be reached at any hour. Please call [phone]  for any of the following symptoms:  Following lower endoscopy (colonoscopy, flexible sigmoidoscopy) Excessive amounts of blood in the stool  Significant tenderness, worsening of abdominal pains  Swelling of the abdomen that is new, acute  Fever of 100 or higher  Following upper endoscopy (EGD, EUS, ERCP, esophageal dilation) Vomiting of blood or coffee ground material  New, significant abdominal pain  New, significant chest pain or pain under the shoulder blades  Painful or persistently difficult swallowing  New shortness of breath  Black, tarry-looking or red, bloody stools  FOLLOW UP: Call if question or problem otherwise no aspirin or anti-inflammatories and just Tylenol only for 2 weeks If any biopsies were taken you will be contacted by phone or by letter within the next 1-3 weeks. Call [phone]  if you have not heard about the biopsies in 1 weeks.  Please also call with any specific questions about appointments or follow up tests.

## 2022-08-14 NOTE — Anesthesia Preprocedure Evaluation (Signed)
Anesthesia Evaluation  Patient identified by MRN, date of birth, ID band Patient awake    Reviewed: Allergy & Precautions, H&P , NPO status , Patient's Chart, lab work & pertinent test results  Airway Mallampati: II  TM Distance: >3 FB Neck ROM: Full    Dental no notable dental hx.    Pulmonary neg pulmonary ROS, former smoker   Pulmonary exam normal breath sounds clear to auscultation       Cardiovascular negative cardio ROS Normal cardiovascular exam Rhythm:Regular Rate:Normal     Neuro/Psych negative neurological ROS  negative psych ROS   GI/Hepatic Neg liver ROS,GERD  ,,  Endo/Other  negative endocrine ROS    Renal/GU negative Renal ROS  negative genitourinary   Musculoskeletal negative musculoskeletal ROS (+)    Abdominal   Peds negative pediatric ROS (+)  Hematology negative hematology ROS (+)   Anesthesia Other Findings   Reproductive/Obstetrics negative OB ROS                             Anesthesia Physical Anesthesia Plan  ASA: 2  Anesthesia Plan: MAC   Post-op Pain Management: Minimal or no pain anticipated   Induction: Intravenous  PONV Risk Score and Plan: 1 and Propofol infusion and Treatment may vary due to age or medical condition  Airway Management Planned: Simple Face Mask  Additional Equipment:   Intra-op Plan:   Post-operative Plan:   Informed Consent: I have reviewed the patients History and Physical, chart, labs and discussed the procedure including the risks, benefits and alternatives for the proposed anesthesia with the patient or authorized representative who has indicated his/her understanding and acceptance.     Dental advisory given  Plan Discussed with: CRNA and Surgeon  Anesthesia Plan Comments:        Anesthesia Quick Evaluation

## 2022-08-15 LAB — SURGICAL PATHOLOGY

## 2022-08-15 NOTE — Op Note (Addendum)
[facility] Patient Name: George Love Procedure Date: 08/15/2022 MRN: [id number] Attending MD: Vida Rigger , MD, [id number] Date of Birth: 10-16-1955 CSN: [id number] Age: 67 Admit Type: Outpatient Procedure:                Upper GI endoscopy Indications:              For therapy of polyps in the duodenum Providers:                Vida Rigger, MD, Janae Sauce. Steele Berg, RN, Fransisca Connors, Marja Kays, Technician Referring MD:              Medicines:                Monitored Anesthesia Care Complications:            No immediate complications. Estimated Blood Loss:     Estimated blood loss: none. Procedure:                Pre-Anesthesia Assessment:                           - Prior to the procedure, a History and Physical                            was performed, and patient medications and                            allergies were reviewed. The patient's tolerance of                            previous anesthesia was also reviewed. The risks                            and benefits of the procedure and the sedation                            options and risks were discussed with the patient.                            All questions were answered, and informed consent                            was obtained. Prior Anticoagulants: The patient has                            taken no anticoagulant or antiplatelet agents. ASA                            Grade Assessment: II - A patient with mild systemic                            disease. After reviewing the risks and benefits,  the patient was deemed in satisfactory condition to                            undergo the procedure.                           After obtaining informed consent, the endoscope was                            passed under direct vision. Throughout the                            procedure, the patient's blood pressure, pulse, and                             oxygen saturations were monitored continuously.The                            upper GI endoscopy was accomplished without                            difficulty. The patient tolerated the procedure                            well. The GIF-[id number] ([id number]) Olympus slim                            endoscope was introduced through the mouth, and                            advanced to the third part of duodenum. The                            [id number]-[id number] ([id number]) Olympus endoscope was introduced                            through the mouth, and advanced to the. Findings:      A small hiatal hernia was present.      Multiple small semi-sessile polyps with no bleeding were found in the       gastric body.      The duodenal bulb, first portion of the duodenum and third portion of       the duodenum were normal.      A single medium-sized semi-pedunculated polyp with no bleeding was found       in the second portion of the duodenum. The polyp was removed with a       piecemeal technique using a hot snare x 3. Resection and retrieval were       complete of the first and third piece but second piece was lost in the       distal duodenum.      The exam was otherwise without abnormality. Impression:               - Small hiatal hernia.                           -  Multiple gastric polyps.                           - Normal duodenal bulb, first portion of the                            duodenum and third portion of the duodenum.                           - A single duodenal polyp. Resected and retrieved.                           - The examination was otherwise normal. Moderate Sedation:      Not Applicable - Patient had care per Anesthesia. Recommendation:           - Full liquid diet for 2 hours.                           - Continue present medications.                           - Await pathology results.                           - Return to GI clinic PRN.                           - Telephone  GI clinic for pathology results in 1                            week.                           - Telephone GI clinic if symptomatic PRN.                           - Repeat upper endoscopy at appointment to be                            scheduled for surveillance based on pathology                            results. Procedure Code(s):        --- Professional ---                           [phone], Esophagogastroduodenoscopy, flexible,                            transoral; with removal of tumor(s), polyp(s), or                            other lesion(s) by snare technique Diagnosis Code(s):        --- Professional ---                           K44.9,  Diaphragmatic hernia without obstruction or                            gangrene                           K31.7, Polyp of stomach and duodenum CPT copyright 2022 [employer]. All rights reserved. The codes documented in this report are preliminary and upon coder review may  be revised to meet current compliance requirements. Vida Rigger, MD 08/15/2022 1:58:25 PM This report has been signed electronically. Number of Addenda: 0

## 2022-08-17 ENCOUNTER — Encounter (HOSPITAL_COMMUNITY): Payer: Self-pay | Admitting: Gastroenterology

## 2022-08-27 ENCOUNTER — Encounter: Payer: 59 | Admitting: Medical

## 2022-09-03 DIAGNOSIS — D1801 Hemangioma of skin and subcutaneous tissue: Secondary | ICD-10-CM | POA: Diagnosis not present

## 2022-09-03 DIAGNOSIS — L821 Other seborrheic keratosis: Secondary | ICD-10-CM | POA: Diagnosis not present

## 2022-09-03 DIAGNOSIS — D2262 Melanocytic nevi of left upper limb, including shoulder: Secondary | ICD-10-CM | POA: Diagnosis not present

## 2022-09-03 DIAGNOSIS — L814 Other melanin hyperpigmentation: Secondary | ICD-10-CM | POA: Diagnosis not present

## 2022-09-03 DIAGNOSIS — L905 Scar conditions and fibrosis of skin: Secondary | ICD-10-CM | POA: Diagnosis not present

## 2022-09-03 DIAGNOSIS — D2272 Melanocytic nevi of left lower limb, including hip: Secondary | ICD-10-CM | POA: Diagnosis not present

## 2022-09-03 DIAGNOSIS — D2261 Melanocytic nevi of right upper limb, including shoulder: Secondary | ICD-10-CM | POA: Diagnosis not present

## 2022-09-03 DIAGNOSIS — D2271 Melanocytic nevi of right lower limb, including hip: Secondary | ICD-10-CM | POA: Diagnosis not present

## 2022-09-03 DIAGNOSIS — Z85828 Personal history of other malignant neoplasm of skin: Secondary | ICD-10-CM | POA: Diagnosis not present

## 2022-09-03 DIAGNOSIS — D225 Melanocytic nevi of trunk: Secondary | ICD-10-CM | POA: Diagnosis not present

## 2022-09-04 ENCOUNTER — Ambulatory Visit (HOSPITAL_BASED_OUTPATIENT_CLINIC_OR_DEPARTMENT_OTHER)
Admission: RE | Admit: 2022-09-04 | Discharge: 2022-09-04 | Disposition: A | Discharge status: Home / Self Care | Payer: 59 | Source: Ambulatory Visit | Attending: Medical | Admitting: Medical

## 2022-09-04 ENCOUNTER — Encounter: Payer: Self-pay | Admitting: Medical

## 2022-09-04 ENCOUNTER — Ambulatory Visit: Payer: 59 | Admitting: Medical

## 2022-09-04 VITALS — BP 137/78 | HR 60 | Temp 98.0°F | Resp 18 | Ht 72.0 in | Wt 246.0 lb

## 2022-09-04 DIAGNOSIS — M5126 Other intervertebral disc displacement, lumbar region: Secondary | ICD-10-CM | POA: Diagnosis not present

## 2022-09-04 DIAGNOSIS — Z23 Encounter for immunization: Secondary | ICD-10-CM | POA: Diagnosis not present

## 2022-09-04 DIAGNOSIS — Z0000 Encounter for general adult medical examination without abnormal findings: Secondary | ICD-10-CM

## 2022-09-04 DIAGNOSIS — M545 Low back pain, unspecified: Secondary | ICD-10-CM

## 2022-09-04 DIAGNOSIS — Z87891 Personal history of nicotine dependence: Secondary | ICD-10-CM | POA: Diagnosis not present

## 2022-09-04 DIAGNOSIS — Z1159 Encounter for screening for other viral diseases: Secondary | ICD-10-CM | POA: Diagnosis not present

## 2022-09-04 DIAGNOSIS — M47816 Spondylosis without myelopathy or radiculopathy, lumbar region: Secondary | ICD-10-CM | POA: Diagnosis not present

## 2022-09-04 DIAGNOSIS — M5137 Other intervertebral disc degeneration, lumbosacral region: Secondary | ICD-10-CM | POA: Diagnosis not present

## 2022-09-04 DIAGNOSIS — M5136 Other intervertebral disc degeneration, lumbar region: Secondary | ICD-10-CM | POA: Diagnosis not present

## 2022-09-04 LAB — CBC WITH DIFFERENTIAL/PLATELET
Basophils Absolute: 0 10*3/uL (ref 0.0–0.1)
Basophils Relative: 0.8 % (ref 0.0–3.0)
Eosinophils Absolute: 0.2 10*3/uL (ref 0.0–0.7)
Eosinophils Relative: 3.9 % (ref 0.0–5.0)
HCT: 38.9 % — ABNORMAL LOW (ref 39.0–52.0)
Hemoglobin: 12.8 g/dL — ABNORMAL LOW (ref 13.0–17.0)
Lymphocytes Relative: 27.6 % (ref 12.0–46.0)
Lymphs Abs: 1.4 10*3/uL (ref 0.7–4.0)
MCHC: 32.9 g/dL (ref 30.0–36.0)
MCV: 90.4 fl (ref 78.0–100.0)
Monocytes Absolute: 0.4 10*3/uL (ref 0.1–1.0)
Monocytes Relative: 8 % (ref 3.0–12.0)
Neutro Abs: 3.1 10*3/uL (ref 1.4–7.7)
Neutrophils Relative %: 59.7 % (ref 43.0–77.0)
Platelets: 220 10*3/uL (ref 150.0–400.0)
RBC: 4.3 Mil/uL (ref 4.22–5.81)
RDW: 12.9 % (ref 11.5–15.5)
WBC: 5.2 10*3/uL (ref 4.0–10.5)

## 2022-09-04 LAB — LIPID PANEL
Cholesterol: 161 mg/dL (ref 0–200)
HDL: 44.2 mg/dL (ref >39.00)
LDL Cholesterol: 95 mg/dL (ref 0–99)
NonHDL: 116.46
Total CHOL/HDL Ratio: 4
Triglycerides: 105 mg/dL (ref 0.0–149.0)
VLDL: 21 mg/dL (ref 0.0–40.0)

## 2022-09-04 LAB — COMPREHENSIVE METABOLIC PANEL
ALT: 18 U/L (ref 0–53)
AST: 15 U/L (ref 0–37)
Albumin: 4.3 g/dL (ref 3.5–5.2)
Alkaline Phosphatase: 55 U/L (ref 39–117)
BUN: 20 mg/dL (ref 6–23)
CO2: 28 mEq/L (ref 19–32)
Calcium: 9.5 mg/dL (ref 8.4–10.5)
Chloride: 107 mEq/L (ref 96–112)
Creatinine, Ser: 0.77 mg/dL (ref 0.40–1.50)
GFR: 93.7 mL/min (ref >60.00)
Glucose, Bld: 93 mg/dL (ref 70–99)
Potassium: 4.1 mEq/L (ref 3.5–5.1)
Sodium: 142 mEq/L (ref 135–145)
Total Bilirubin: 0.5 mg/dL (ref 0.2–1.2)
Total Protein: 6.4 g/dL (ref 6.0–8.3)

## 2022-09-04 NOTE — Addendum Note (Signed)
Addended by: Gwenevere Abbot on: 09/04/2022 09:16 PM   Modules accepted: Orders

## 2022-09-04 NOTE — Addendum Note (Signed)
Addended by: Maximino Sarin on: 09/04/2022 10:10 AM   Modules accepted: Orders

## 2022-09-04 NOTE — Progress Notes (Addendum)
Subjective:    Patient ID: George Love, male    DOB: 08-Oct-1955, 67 y.o.   MRN: [id number]  HPI  Pt in for wellness exam.   Pt is fasting.   Wellness exam today.   Pt wife works for [employer]. 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 Pt states will exercise intermittently. He states when in country will work out. He travels a lot [place] will exercise less.   Up to date on colonoscopy.  Will get pcv 20 vaccine today. He states he got shingrix vaccine in past.  Tdap today.  Former smoker- smoked pack a day for 20 years.  Htn- pt bp at home 120/70 most of the time. Sometimes bp is 140/80. He thinks maybe checks after he drinks coffee.   Pt has anxiety- he is on lexapro. He has been on for 15 years. He thinks may want to taper off since mood controlled.  Gerd- controlled on protonix.      Review of Systems  Constitutional:  Negative for chills, fatigue and fever.  HENT:  Negative for congestion, drooling and ear discharge.   Respiratory:  Negative for chest tightness, shortness of breath and wheezing.   Cardiovascular:  Negative for chest pain and palpitations.  Gastrointestinal:  Negative for abdominal pain, blood in stool, constipation, diarrhea and nausea.  Genitourinary:  Negative for dysuria, flank pain and genital sores.  Musculoskeletal:  Positive for back pain.       Lumbar back pain for months. He notes pain at times when he works out. Pain for 2-20months.  Skin:  Negative for rash and wound.   Past Medical History:  Diagnosis Date   Anxiety    GERD (gastroesophageal reflux disease)    Hyperlipidemia      Social History   Socioeconomic History   Marital status: Married    Spouse name: Not on file   Number of children: Not on file   Years of education: Not on file   Highest education level: Not on file  Occupational History   Not on file  Tobacco Use   Smoking status: Former    Current packs/day: 0.00    Average packs/day: 1 pack/day for 20.0  years (20.0 ttl pk-yrs)    Types: Cigarettes    Start date: 10/27/1993    Quit date: 10/27/2013    Years since quitting: 8.8   Smokeless tobacco: Never  Vaping Use   Vaping status: Never Used  Substance and Sexual Activity   Alcohol use: Yes    Comment: socially. 3-4 spready out over a week.   Drug use: Never   Sexual activity: Yes  Other Topics Concern   Not on file  Social History Narrative   Not on file   Social Determinants of Health   Financial Resource Strain: Not on file  Food Insecurity: Not on file  Transportation Needs: Not on file  Physical Activity: Not on file  Stress: Not on file  Social Connections: Not on file  Intimate Partner Violence: Not on file    Past Surgical History:  Procedure Laterality Date   ESOPHAGOGASTRODUODENOSCOPY (EGD) WITH PROPOFOL N/A 08/14/2022   Procedure: ESOPHAGOGASTRODUODENOSCOPY (EGD) WITH PROPOFOL;  Surgeon: Vida Rigger, MD;  Location: [facility] ENDOSCOPY;  Service: Gastroenterology;  Laterality: N/A;   KNEE ARTHROSCOPY Right    POLYPECTOMY  08/14/2022   Procedure: POLYPECTOMY;  Surgeon: Vida Rigger, MD;  Location: [facility] ENDOSCOPY;  Service: Gastroenterology;;   PROSTATE SURGERY      No family history  on file.  No Known Allergies  Current Outpatient Medications on File Prior to Visit  Medication Sig Dispense Refill   Ascorbic Acid (VITAMIN C) 1000 MG tablet Take 1,000 mg by mouth daily.     cholecalciferol (VITAMIN D3) 25 MCG (1000 UNIT) tablet Take 1,000 Units by mouth daily.     escitalopram (LEXAPRO) 10 MG tablet Take 1 tablet (10 mg total) by mouth daily. 90 tablet 1   Menatetrenone (VITAMIN K2) 100 MCG TABS Take 100 mcg by mouth daily.     Multiple Vitamin (MULTIVITAMIN) tablet Take 1 tablet by mouth daily.     pantoprazole (PROTONIX) 40 MG tablet Take 1 tablet (40 mg total) by mouth daily. 90 tablet 1   Potassium 99 MG TABS Take 2 tablets by mouth every evening.     Red Yeast Rice 600 MG CAPS Take 1,200 mg by mouth daily.     zinc  sulfate 220 (50 Zn) MG capsule Take 220 mg by mouth daily.     No current facility-administered medications on file prior to visit.    BP 137/78   Pulse 60   Temp 98 F (36.7 C)   Resp 18   Ht 6' (1.829 m)   Wt 246 lb (111.6 kg)   SpO2 97%   BMI 33.36 kg/m        Objective:   Physical Exam  General Mental Status- Alert. General Appearance- Not in acute distress.   Skin General: Color- Normal Color. Moisture- Normal Moisture.  Neck Carotid Arteries- Normal color. Moisture- Normal Moisture. No carotid bruits. No JVD.  Chest and Lung Exam Auscultation: Breath Sounds:-Normal.  Cardiovascular Auscultation:Rythm- Regular. Murmurs & Other Heart Sounds:Auscultation of the heart reveals- No Murmurs.  Abdomen Inspection:-Inspeection Normal. Palpation/Percussion:Note:No mass. Palpation and Percussion of the abdomen reveal- Non Tender, Non Distended + BS, no rebound or guarding.   Neurologic Cranial Nerve exam:- CN III-XII intact(No nystagmus), symmetric smile. Strength:- 5/5 equal and symmetric strength both upper and lower extremities.        Assessment & Plan:  For you wellness exam today I have ordered cbc, cmp, hep c antibody and  lipid panel.  Vaccine given today. Tdap and pcv 20.   Referred to pulmonologist   Recommend exercise and healthy diet.  We will let you know lab results as they come in.  Follow up date appointment will be determined after lab review.    Bp better to day on recheck.   Anxiety well controlled. Can continue lexapro same dose 10 mg. If you want to use lower dose in future just let me know.   Genella Rife- gi md about to try to switch you to famotadine.  [place], [place]      [id number] charge as did address back pain and discussed approach to anxiety.

## 2022-09-04 NOTE — Patient Instructions (Addendum)
For you wellness exam today I have ordered cbc, cmp, hep c antibody and  lipid panel.  Vaccine given today. Tdap and pcv 20.   Referred to pulmonologist   Recommend exercise and healthy diet.  We will let you know lab results as they come in.  Follow up date appointment will be determined after lab review.    Bp better to day on recheck.   Anxiety well controlled. Can continue lexapro same dose 10 mg. If you want to use lower dose in future just let me know.   Genella Rife- gi md about to try to switch you to famotadine.  For low back pain 2-3 months. Get xray. Then advise on potential referral and tx.  Preventive Care 97 Years and Older, Male Preventive care refers to lifestyle choices and visits with your health care provider that can promote health and wellness. Preventive care visits are also called wellness exams. What can I expect for my preventive care visit? Counseling During your preventive care visit, your health care provider may ask about your: Medical history, including: Past medical problems. Family medical history. History of falls. Current health, including: Emotional well-being. Home life and relationship well-being. Sexual activity. Memory and ability to understand (cognition). Lifestyle, including: Alcohol, nicotine or tobacco, and drug use. Access to firearms. Diet, exercise, and sleep habits. Work and work Astronomer. Sunscreen use. Safety issues such as seatbelt and bike helmet use. Physical exam Your health care provider will check your: Height and weight. These may be used to calculate your BMI (body mass index). BMI is a measurement that tells if you are at a healthy weight. Waist circumference. This measures the distance around your waistline. This measurement also tells if you are at a healthy weight and may help predict your risk of certain diseases, such as type 2 diabetes and high blood pressure. Heart rate and blood pressure. Body  temperature. Skin for abnormal spots. What immunizations do I need?  Vaccines are usually given at various ages, according to a schedule. Your health care provider will recommend vaccines for you based on your age, medical history, and lifestyle or other factors, such as travel or where you work. What tests do I need? Screening Your health care provider may recommend screening tests for certain conditions. This may include: Lipid and cholesterol levels. Diabetes screening. This is done by checking your blood sugar (glucose) after you have not eaten for a while (fasting). Hepatitis C test. Hepatitis B test. HIV (human immunodeficiency virus) test. STI (sexually transmitted infection) testing, if you are at risk. Lung cancer screening. Colorectal cancer screening. Prostate cancer screening. Abdominal aortic aneurysm (AAA) screening. You may need this if you are a current or former smoker. Talk with your health care provider about your test results, treatment options, and if necessary, the need for more tests. Follow these instructions at home: Eating and drinking  Eat a diet that includes fresh fruits and vegetables, whole grains, lean protein, and low-fat dairy products. Limit your intake of foods with high amounts of sugar, saturated fats, and salt. Take vitamin and mineral supplements as recommended by your health care provider. Do not drink alcohol if your health care provider tells you not to drink. If you drink alcohol: Limit how much you have to 0-2 drinks a day. Know how much alcohol is in your drink. In the [place] one drink equals one 12 oz bottle of beer (355 mL), one 5 oz glass of wine (148 mL), or one 1 oz glass of  hard liquor (44 mL). Lifestyle Brush your teeth every morning and night with fluoride toothpaste. Floss one time each day. Exercise for at least 30 minutes 5 or more days each week. Do not use any products that contain nicotine or tobacco. These products include  cigarettes, chewing tobacco, and vaping devices, such as e-cigarettes. If you need help quitting, ask your health care provider. Do not use drugs. If you are sexually active, practice safe sex. Use a condom or other form of protection to prevent STIs. Take aspirin only as told by your health care provider. Make sure that you understand how much to take and what form to take. Work with your health care provider to find out whether it is safe and beneficial for you to take aspirin daily. Ask your health care provider if you need to take a cholesterol-lowering medicine (statin). Find healthy ways to manage stress, such as: Meditation, yoga, or listening to music. Journaling. Talking to a trusted person. Spending time with friends and family. Safety Always wear your seat belt while driving or riding in a vehicle. Do not drive: If you have been drinking alcohol. Do not ride with someone who has been drinking. When you are tired or distracted. While texting. If you have been using any mind-altering substances or drugs. Wear a helmet and other protective equipment during sports activities. If you have firearms in your house, make sure you follow all gun safety procedures. Minimize exposure to UV radiation to reduce your risk of skin cancer. What's next? Visit your health care provider once a year for an annual wellness visit. Ask your health care provider how often you should have your eyes and teeth checked. Stay up to date on all vaccines. This information is not intended to replace advice given to you by your health care provider. Make sure you discuss any questions you have with your health care provider. Document Revised: 08/03/2020 Document Reviewed: 08/03/2020 Elsevier Patient Education  2024 [employer].

## 2022-09-04 NOTE — Addendum Note (Signed)
Addended by: Gwenevere Abbot on: 09/04/2022 09:46 AM   Modules accepted: Orders

## 2022-09-05 LAB — HEPATITIS C ANTIBODY: Hepatitis C Ab: NONREACTIVE

## 2022-10-17 ENCOUNTER — Other Ambulatory Visit: Payer: Self-pay | Admitting: Medical

## 2022-10-17 ENCOUNTER — Other Ambulatory Visit (HOSPITAL_BASED_OUTPATIENT_CLINIC_OR_DEPARTMENT_OTHER): Payer: Self-pay

## 2022-10-17 MED ORDER — ESCITALOPRAM OXALATE 10 MG PO TABS
10.0000 mg | ORAL_TABLET | Freq: Every day | ORAL | 3 refills | Status: DC
  Filled 2022-10-17: qty 90, 90d supply, fill #0
  Filled 2023-01-08: qty 90, 90d supply, fill #1

## 2022-10-26 ENCOUNTER — Other Ambulatory Visit: Payer: 59

## 2022-10-26 DIAGNOSIS — R972 Elevated prostate specific antigen [PSA]: Secondary | ICD-10-CM

## 2022-10-27 LAB — PSA: Prostate Specific Ag, Serum: 9.5 ng/mL — ABNORMAL HIGH (ref 0.0–4.0)

## 2022-11-02 ENCOUNTER — Ambulatory Visit (INDEPENDENT_AMBULATORY_CARE_PROVIDER_SITE_OTHER): Payer: 59 | Admitting: Urology

## 2022-11-02 ENCOUNTER — Other Ambulatory Visit (HOSPITAL_BASED_OUTPATIENT_CLINIC_OR_DEPARTMENT_OTHER): Payer: Self-pay

## 2022-11-02 VITALS — BP 121/74 | HR 55

## 2022-11-02 DIAGNOSIS — R351 Nocturia: Secondary | ICD-10-CM | POA: Diagnosis not present

## 2022-11-02 DIAGNOSIS — R972 Elevated prostate specific antigen [PSA]: Secondary | ICD-10-CM | POA: Diagnosis not present

## 2022-11-02 DIAGNOSIS — R3915 Urgency of urination: Secondary | ICD-10-CM

## 2022-11-02 LAB — URINALYSIS, ROUTINE W REFLEX MICROSCOPIC
Bilirubin, UA: NEGATIVE
Glucose, UA: NEGATIVE
Ketones, UA: NEGATIVE
Leukocytes,UA: NEGATIVE
Nitrite, UA: NEGATIVE
Protein,UA: NEGATIVE
RBC, UA: NEGATIVE
Specific Gravity, UA: 1.03 (ref 1.005–1.030)
Urobilinogen, Ur: 1 mg/dL (ref 0.2–1.0)
pH, UA: 6 (ref 5.0–7.5)

## 2022-11-02 LAB — BLADDER SCAN AMB NON-IMAGING: Scan Result: 3

## 2022-11-02 MED ORDER — GEMTESA 75 MG PO TABS
1.0000 | ORAL_TABLET | Freq: Every day | ORAL | 11 refills | Status: DC
  Filled 2022-11-02: qty 30, 30d supply, fill #0

## 2022-11-02 NOTE — Progress Notes (Unsigned)
post void residual=3

## 2022-11-02 NOTE — Progress Notes (Unsigned)
11/02/2022 10:18 AM   Lucia Estelle [birth year] [id number]  Referring provider: Esperanza Richters, PA-C [address] [place],  [place] [address]  No chief complaint on file.   HPI: Leslye Peer helped his urgency and incontinence. PSA increase to 9.5 from 7.8   PMH: Past Medical History:  Diagnosis Date   Anxiety    GERD (gastroesophageal reflux disease)    Hyperlipidemia     Surgical History: Past Surgical History:  Procedure Laterality Date   ESOPHAGOGASTRODUODENOSCOPY (EGD) WITH PROPOFOL N/A 08/14/2022   Procedure: ESOPHAGOGASTRODUODENOSCOPY (EGD) WITH PROPOFOL;  Surgeon: Vida Rigger, MD;  Location: [facility];  Service: Gastroenterology;  Laterality: N/A;   KNEE ARTHROSCOPY Right    POLYPECTOMY  08/14/2022   Procedure: POLYPECTOMY;  Surgeon: Vida Rigger, MD;  Location: [facility];  Service: Gastroenterology;;   PROSTATE SURGERY      Home Medications:  Allergies as of 11/02/2022   No Known Allergies      Medication List        Accurate as of November 02, 2022 10:18 AM. If you have any questions, ask your nurse or doctor.          cholecalciferol 25 MCG (1000 UNIT) tablet Commonly known as: VITAMIN D3 Take 1,000 Units by mouth daily.   escitalopram 10 MG tablet Commonly known as: Lexapro Take 1 tablet (10 mg total) by mouth daily.   multivitamin tablet Take 1 tablet by mouth daily.   pantoprazole 40 MG tablet Commonly known as: PROTONIX Take 1 tablet (40 mg total) by mouth daily.   Potassium 99 MG Tabs Take 2 tablets by mouth every evening.   Red Yeast Rice 600 MG Caps Take 1,200 mg by mouth daily.   vitamin C 1000 MG tablet Take 1,000 mg by mouth daily.   Vitamin K2 100 MCG Tabs Take 100 mcg by mouth daily.   zinc sulfate 220 (50 Zn) MG capsule Take 220 mg by mouth daily.        Allergies: No Known Allergies  Family History: No family history on file.  Social History:  reports that he quit smoking about 9 years  ago. His smoking use included cigarettes. He started smoking about 29 years ago. He has a 20 pack-year smoking history. He has never used smokeless tobacco. He reports current alcohol use. He reports that he does not use drugs.  ROS: All other review of systems were reviewed and are negative except what is noted above in HPI  Physical Exam: BP 121/74   Pulse (!) 55   Constitutional:  Alert and oriented, No acute distress. HEENT: [place] AT, moist mucus membranes.  Trachea midline, no masses. Cardiovascular: No clubbing, cyanosis, or edema. Respiratory: Normal respiratory effort, no increased work of breathing. GI: Abdomen is soft, nontender, nondistended, no abdominal masses GU: No CVA tenderness.  Lymph: No cervical or inguinal lymphadenopathy. Skin: No rashes, bruises or suspicious lesions. Neurologic: Grossly intact, no focal deficits, moving all 4 extremities. Psychiatric: Normal mood and affect.  Laboratory Data: Lab Results  Component Value Date   WBC 5.2 09/04/2022   HGB 12.8 (L) 09/04/2022   HCT 38.9 (L) 09/04/2022   MCV 90.4 09/04/2022   PLT 220.0 09/04/2022    Lab Results  Component Value Date   CREATININE 0.77 09/04/2022    Lab Results  Component Value Date   PSA 6.18 (H) 08/23/2021    No results found for: "TESTOSTERONE"  No results found for: "HGBA1C"  Urinalysis    Component  Value Date/Time   APPEARANCEUR Clear 05/04/2022 0915   GLUCOSEU Negative 05/04/2022 0915   BILIRUBINUR Negative 05/04/2022 0915   PROTEINUR Negative 05/04/2022 0915   NITRITE Negative 05/04/2022 0915   LEUKOCYTESUR Negative 05/04/2022 0915    Lab Results  Component Value Date   LABMICR Comment 05/04/2022   WBCUA 6-10 (A) 11/01/2021   LABEPIT 0-10 11/01/2021   BACTERIA Few (A) 11/01/2021    Pertinent Imaging: *** No results found for this or any previous visit.  No results found for this or any previous visit.  No results found for this or any previous visit.  No  results found for this or any previous visit.  No results found for this or any previous visit.  No valid procedures specified. No results found for this or any previous visit.  No results found for this or any previous visit.   Assessment & Plan:    1. Elevated PSA *** - Urinalysis, Routine w reflex microscopic - BLADDER SCAN AMB NON-IMAGING  2. Urinary urgency Restart gemtesa  3. Nocturia ***   No follow-ups on file.  Wilkie Aye, MD  [facility] [place]

## 2022-11-05 ENCOUNTER — Other Ambulatory Visit (HOSPITAL_BASED_OUTPATIENT_CLINIC_OR_DEPARTMENT_OTHER): Payer: Self-pay

## 2022-11-06 ENCOUNTER — Encounter: Payer: Self-pay | Admitting: Urology

## 2022-11-06 ENCOUNTER — Other Ambulatory Visit (HOSPITAL_BASED_OUTPATIENT_CLINIC_OR_DEPARTMENT_OTHER): Payer: Self-pay

## 2022-11-06 NOTE — Patient Instructions (Signed)

## 2022-11-08 ENCOUNTER — Other Ambulatory Visit (HOSPITAL_BASED_OUTPATIENT_CLINIC_OR_DEPARTMENT_OTHER): Payer: Self-pay

## 2022-11-09 ENCOUNTER — Other Ambulatory Visit (HOSPITAL_BASED_OUTPATIENT_CLINIC_OR_DEPARTMENT_OTHER): Payer: Self-pay

## 2022-11-11 ENCOUNTER — Encounter: Payer: Self-pay | Admitting: Urology

## 2022-11-11 ENCOUNTER — Encounter: Payer: Self-pay | Admitting: Medical

## 2022-11-12 NOTE — Addendum Note (Signed)
Addended by: Gwenevere Abbot on: 11/12/2022 05:58 PM   Modules accepted: Orders

## 2022-11-13 ENCOUNTER — Ambulatory Visit (INDEPENDENT_AMBULATORY_CARE_PROVIDER_SITE_OTHER): Payer: 59

## 2022-11-13 DIAGNOSIS — Z23 Encounter for immunization: Secondary | ICD-10-CM | POA: Diagnosis not present

## 2022-11-14 ENCOUNTER — Ambulatory Visit (HOSPITAL_COMMUNITY)
Admission: RE | Admit: 2022-11-14 | Discharge: 2022-11-14 | Disposition: A | Discharge status: Home / Self Care | Payer: 59 | Source: Ambulatory Visit | Attending: Urology | Admitting: Urology

## 2022-11-14 DIAGNOSIS — R972 Elevated prostate specific antigen [PSA]: Secondary | ICD-10-CM | POA: Insufficient documentation

## 2022-11-14 DIAGNOSIS — N400 Enlarged prostate without lower urinary tract symptoms: Secondary | ICD-10-CM | POA: Diagnosis not present

## 2022-11-14 DIAGNOSIS — K5730 Diverticulosis of large intestine without perforation or abscess without bleeding: Secondary | ICD-10-CM | POA: Diagnosis not present

## 2022-11-14 DIAGNOSIS — N4289 Other specified disorders of prostate: Secondary | ICD-10-CM | POA: Diagnosis not present

## 2022-11-14 MED ORDER — GADOBUTROL 1 MMOL/ML IV SOLN
10.0000 mL | Freq: Once | INTRAVENOUS | Status: AC | PRN
  Administered 2022-11-14: 10 mL via INTRAVENOUS

## 2022-11-14 NOTE — Therapy (Signed)
OUTPATIENT PHYSICAL THERAPY THORACOLUMBAR EVALUATION   Patient Name: George Love MRN: [id number] DOB:Mar 20, 1955, 67 y.o., male Today's Date: 11/15/2022  END OF SESSION:  PT End of Session - 11/15/22 0848     Visit Number 1    Date for PT Re-Evaluation 12/27/22    Authorization Type [facility] [employer]    PT Start Time 0848    PT Stop Time 0939    PT Time Calculation (min) 51 min    Activity Tolerance Patient tolerated treatment well    Behavior During Therapy WFL for tasks assessed/performed             Past Medical History:  Diagnosis Date   Anxiety    GERD (gastroesophageal reflux disease)    Hyperlipidemia    Past Surgical History:  Procedure Laterality Date   ESOPHAGOGASTRODUODENOSCOPY (EGD) WITH PROPOFOL N/A 08/14/2022   Procedure: ESOPHAGOGASTRODUODENOSCOPY (EGD) WITH PROPOFOL;  Surgeon: Vida Rigger, MD;  Location: [facility];  Service: Gastroenterology;  Laterality: N/A;   KNEE ARTHROSCOPY Right    POLYPECTOMY  08/14/2022   Procedure: POLYPECTOMY;  Surgeon: Vida Rigger, MD;  Location: [facility];  Service: Gastroenterology;;   PROSTATE SURGERY     Patient Active Problem List   Diagnosis Date Noted   urinary urgency 11/01/2021   Elevated PSA 10/28/2019    PCP: Marisue [place]   REFERRING PROVIDER: Esperanza Richters, PA-C   REFERRING DIAG: M54.50 (ICD-10-CM) - Midline low back pain without sciatica, unspecified chronicity   THERAPY DIAG:  Other low back pain  Muscle spasm of back  Muscle weakness (generalized)  Other abnormalities of gait and mobility  RATIONALE FOR EVALUATION AND TREATMENT: Rehabilitation  ONSET DATE: ~6 months  NEXT MD VISIT: 11/16/22 & 09/05/23   SUBJECTIVE:                                                                                                                                                                                                         SUBJECTIVE STATEMENT: Pt reports pain in his upper lumbar spine for  the last ~6 months that is most problematic when he goes to get up from laying down - he states the muscles feel like they want to spasm or cramp. Pain/muscle spasms are typically short-lived (<15 sec). Also notes this may occur when he rolls into certain positions at night. He does not recall any triggering incident and states it is incapacitating at the moment but then resolves. He notes increased stiffness in his back after doing activities such as playing golf which typically make the muscle spasm episodes  worse the next time he goes to lay down. He denies any numbness or tingling.   PAIN: Are you having pain? Yes: NPRS scale: 0/10, when episodes occur typically 8/10 Pain location: midline & bilateral low back in upper lumbar spine (near thoracolumbar junction) Pain description: muscle spasm/cramping Aggravating factors: going to get up from laying down Relieving factors: muscle relaxants when severe  PERTINENT HISTORY:  anxiety, GERD, HLD, h/o R knee scope  PRECAUTIONS: None  RED FLAGS: None  WEIGHT BEARING RESTRICTIONS: No  FALLS:  Has patient fallen in last 6 months? 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 Lower lumbar facet osteoarthritis. 2 mm of retrolisthesis at L3-4.  PATIENT SURVEYS:  Modified Oswestry 12 / 50 = 24.0 %    SCREENING FOR RED FLAGS: Bowel or bladder incontinence: No Spinal tumors: No Cauda equina syndrome: No Compression fracture: No Abdominal aneurysm: No  COGNITION:  Overall cognitive status: Within functional limits for tasks assessed    SENSATION: WFL  MUSCLE LENGTH: Hamstrings: Mild tight B  ITB: Mild tight B Piriformis: Mild tight R>L Hip flexors: Mild/mod tight B Quads: Mild tight B  POSTURE:  No Significant postural limitations  PALPATION: Increased muscle tension/taut bands along B thoracolumbar paraspinals > QL/psoas but pt denies significant TTP  LUMBAR ROM:   Active  Eval  Flexion WNL  Extension WNL  Right lateral flexion WNL  Left lateral flexion WNL  Right rotation WNL  Left rotation WNL  (Blank rows = not tested)  LOWER EXTREMITY ROM:    Grossly WFL/WNL  LOWER EXTREMITY MMT:    MMT Right eval Left eval  Hip flexion 5 5  Hip extension 4 4+  Hip abduction 5 5  Hip adduction 5 5  Hip internal rotation 5 5  Hip external rotation 4 4  Knee flexion 5 5  Knee extension 5 5  Ankle dorsiflexion 5 5  Ankle plantarflexion    Ankle inversion    Ankle eversion     (Blank rows = not tested)  LUMBAR SPECIAL TESTS:  Straight leg raise test: Negative, Slump test: Negative, and FABER test: Negative   TODAY'S TREATMENT:   11/15/22 - Eval THERAPEUTIC EXERCISE: to improve flexibility, strength and mobility.  Demonstration, verbal and tactile cues throughout for technique. Seated hip hinge HS x 30" - cues to keep back straight and bend forward at hips Supine HS stretch with strap x 30" - cues to keep knee straight and avoid rounding back SKTC stretch with opposite LE straight to maintain neutral spine x 30" Hooklying LTR 3 x 10" 3-way child's pose stretch x 30" each   PATIENT EDUCATION:  Education details: PT eval findings, anticipated POC, initial HEP, and role of DN Person educated: Patient Education method: Explanation, Demonstration, Verbal cues,  and MedBridgeGO access code emailed to patient Education comprehension: verbalized understanding, returned demonstration, verbal cues required, and needs further education  HOME  EXERCISE PROGRAM: *Access Code: [email or web address] URL: [email or web address] Date: 11/15/2022 Prepared by: Glenetta Hew  Exercises - Seated Hamstring Stretch  - 2 x daily - 7 x weekly - 3 reps - 30 sec hold - Supine Hamstring Stretch with Strap  - 2 x daily - 7 x weekly - 3 reps - 30 sec hold - Single Knee to Chest Stretch  - 2 x daily - 7 x weekly - 3 reps - 30 sec hold - Supine Lower Trunk Rotation  - 2 x daily - 7 x weekly - 2 sets - 5 reps - 10-15 sec hold - Child's Pose Stretch  - 2 x daily - 7 x weekly - 3 reps - 30 sec hold - Child's Pose with Sidebending  - 2 x daily - 7 x weekly - 2-3 reps - 30 sec hold  Patient Education - Trigger Point Dry Needling  *MedBridgeGO access code emailed to patient   ASSESSMENT:  CLINICAL IMPRESSION: George Love is a 67 y.o. male who was referred to physical therapy for evaluation and treatment for midline low back pain without sciatica.  He reports onset of pain ~6 months ago without known triggering event/MOI.  Pain typically occurs as a brief strong muscle spasm or severe cramp when attempting to transition from laying down back to upright, and typically subsides within 15 seconds, although he also notes some pain during rolling and change of position while in bed.  He reports minimal to no pain when upright aside from the initial pain upon rising and denies any numbness or tingling into lower extremities.  Lumbar ROM WNL however some decreased proximal LE flexibility evident as well as increased muscle tension and taut bands in lumbar paraspinals and QL/psoas muscle.  Mild weakness evident in B hip extension and ER but otherwise BLE strength grossly 5/5.  Initial HEP provided modifying his current stretching regimen to better isolate individual muscles and he will  likely benefit from DN to lumbar paraspinals +/- QL and psoas muscles to help further address muscle tension.  George "Michele Mcalpine" will benefit from skilled PT to address above deficits to improve mobility and activity tolerance with decreased pain interference.   OBJECTIVE IMPAIRMENTS: decreased activity tolerance, decreased knowledge of condition, decreased mobility, decreased strength, hypomobility, increased fascial restrictions, impaired perceived functional ability, increased muscle spasms, impaired flexibility, improper body mechanics, postural dysfunction, and pain.   ACTIVITY LIMITATIONS: lifting, bending, sleeping, transfers, and bed mobility  PARTICIPATION LIMITATIONS: community activity and recreational activities and gym workouts  PERSONAL FACTORS: Past/current experiences, Time since onset of injury/illness/exacerbation, and 3+ comorbidities: anxiety, GERD, HLD, h/o R knee scope  are also affecting patient's functional outcome.   REHAB POTENTIAL: Excellent  CLINICAL DECISION MAKING: Stable/uncomplicated  EVALUATION COMPLEXITY: Low   GOALS: Goals reviewed with patient? Yes  SHORT TERM GOALS: Target date: 12/06/2022  Patient will be independent with initial HEP to improve outcomes and carryover.  Baseline: HEP provided on eval Goal status: INITIAL  2.  Patient will 25% reduction in low back pain upon transitional motions.  Baseline: 8/10 Goal status: INITIAL  LONG TERM GOALS: Target date: 12/27/2022  Patient will be independent with ongoing/advanced HEP for self-management at home.  Baseline:  Goal status: INITIAL  2.  Patient will report 50-75% improvement in low back pain during transition movements supine to sit or rolling over in bed to improve QOL.  Baseline: 8/10 Goal status: INITIAL  3.  Patient to demonstrate ability to achieve and maintain good spinal alignment/posturing and body  mechanics needed for daily activities. Baseline:  Goal status: INITIAL  4.   Patient will demonstrate improved B LE strength to 5/5 for improved stability and ease of mobility . Baseline: Refer to above LE MMT table Goal status: INITIAL  5. Patient will report </= 12% on Modified Oswestry to demonstrate improved functional ability with decreased pain interference. Baseline: 12 / 50 = 24.0 % Goal status: INITIAL  6.  Patient will will be able to get up from bed or supine workouts without triggering muscle spasm in ow back for improved mobility and activity tolerance. Baseline:  Goal status: INITIAL  7.  Patient will report no sleep disturbance due to pain when repositioning or turning over in bed. Baseline:  Goal status: INITIAL   PLAN:  PT FREQUENCY: 1-2x/week  PT DURATION: 8 weeks  PLANNED INTERVENTIONS: Therapeutic exercises, Therapeutic activity, Neuromuscular re-education, Balance training, Patient/Family education, Self Care, Joint mobilization, Dry Needling, Electrical stimulation, Spinal manipulation, Spinal mobilization, Cryotherapy, Moist heat, Taping, Traction, Ultrasound, Manual therapy, and Re-evaluation  PLAN FOR NEXT SESSION: Review initial HEP; progress lumbopelvic flexibility, ROM and strengthening; provide education in proper body mechanics for transitional motions and daily activities; MT +/- DN to address abnormal muscle tension in lumbar paraspinals, QL and psoas muscles; modalities as needed for pain management   Marry Guan, PT 11/15/2022, 12:23 PM

## 2022-11-15 ENCOUNTER — Encounter: Payer: Self-pay | Admitting: Physical Therapy

## 2022-11-15 ENCOUNTER — Other Ambulatory Visit: Payer: Self-pay

## 2022-11-15 ENCOUNTER — Ambulatory Visit: Payer: 59 | Attending: Medical | Admitting: Physical Therapy

## 2022-11-15 DIAGNOSIS — R2689 Other abnormalities of gait and mobility: Secondary | ICD-10-CM | POA: Diagnosis not present

## 2022-11-15 DIAGNOSIS — M545 Low back pain, unspecified: Secondary | ICD-10-CM | POA: Diagnosis not present

## 2022-11-15 DIAGNOSIS — Z122 Encounter for screening for malignant neoplasm of respiratory organs: Secondary | ICD-10-CM

## 2022-11-15 DIAGNOSIS — M5459 Other low back pain: Secondary | ICD-10-CM | POA: Diagnosis not present

## 2022-11-15 DIAGNOSIS — M62830 Muscle spasm of back: Secondary | ICD-10-CM | POA: Diagnosis not present

## 2022-11-15 DIAGNOSIS — M6281 Muscle weakness (generalized): Secondary | ICD-10-CM | POA: Diagnosis not present

## 2022-11-15 DIAGNOSIS — Z87891 Personal history of nicotine dependence: Secondary | ICD-10-CM

## 2022-11-16 ENCOUNTER — Encounter: Payer: 59 | Admitting: Medical

## 2022-11-16 ENCOUNTER — Other Ambulatory Visit (HOSPITAL_BASED_OUTPATIENT_CLINIC_OR_DEPARTMENT_OTHER): Payer: Self-pay

## 2022-11-16 ENCOUNTER — Encounter: Payer: Self-pay | Admitting: Medical

## 2022-11-16 NOTE — Progress Notes (Signed)
This encounter was created in error - please disregard.

## 2022-11-20 ENCOUNTER — Ambulatory Visit: Payer: No Typology Code available for payment source | Attending: Medical

## 2022-11-20 DIAGNOSIS — M6281 Muscle weakness (generalized): Secondary | ICD-10-CM | POA: Diagnosis present

## 2022-11-20 DIAGNOSIS — M5459 Other low back pain: Secondary | ICD-10-CM | POA: Insufficient documentation

## 2022-11-20 DIAGNOSIS — R2689 Other abnormalities of gait and mobility: Secondary | ICD-10-CM | POA: Insufficient documentation

## 2022-11-20 DIAGNOSIS — M62830 Muscle spasm of back: Secondary | ICD-10-CM | POA: Insufficient documentation

## 2022-11-20 NOTE — Therapy (Signed)
OUTPATIENT PHYSICAL THERAPY THORACOLUMBAR TREATMENT   Patient Name: George Love MRN: [id number] DOB:1955-06-17, 67 y.o., male Today's Date: 11/20/2022  END OF SESSION:  PT End of Session - 11/20/22 0924     Visit Number 2    Date for PT Re-Evaluation 12/27/22    Authorization Type [facility] [employer]    PT Start Time [phone]    PT Stop Time 0930    PT Time Calculation (min) 41 min    Activity Tolerance Patient tolerated treatment well    Behavior During Therapy WFL for tasks assessed/performed              Past Medical History:  Diagnosis Date   Anxiety    GERD (gastroesophageal reflux disease)    Hyperlipidemia    Past Surgical History:  Procedure Laterality Date   ESOPHAGOGASTRODUODENOSCOPY (EGD) WITH PROPOFOL N/A 08/14/2022   Procedure: ESOPHAGOGASTRODUODENOSCOPY (EGD) WITH PROPOFOL;  Surgeon: Vida Rigger, MD;  Location: [facility];  Service: Gastroenterology;  Laterality: N/A;   KNEE ARTHROSCOPY Right    POLYPECTOMY  08/14/2022   Procedure: POLYPECTOMY;  Surgeon: Vida Rigger, MD;  Location: [facility];  Service: Gastroenterology;;   PROSTATE SURGERY     Patient Active Problem List   Diagnosis Date Noted   urinary urgency 11/01/2021   Elevated PSA 10/28/2019    PCP: Marisue Brooklyn   REFERRING PROVIDER: Esperanza Richters, PA-C   REFERRING DIAG: M54.50 (ICD-10-CM) - Midline low back pain without sciatica, unspecified chronicity   THERAPY DIAG:  Other low back pain  Muscle spasm of back  Muscle weakness (generalized)  Other abnormalities of gait and mobility  RATIONALE FOR EVALUATION AND TREATMENT: Rehabilitation  ONSET DATE: ~6 months  NEXT MD VISIT: 11/16/22 & 09/05/23   SUBJECTIVE:                                                                                                                                                                                                         SUBJECTIVE STATEMENT: Pt denies any improvement thus far. Sitting up  from lying down is still the worst.   PAIN: Are you having pain? Yes: NPRS scale: 0/10, when episodes occur typically 8/10 Pain location: midline & bilateral low back in upper lumbar spine (near thoracolumbar junction) Pain description: muscle spasm/cramping Aggravating factors: going to get up from laying down Relieving factors: muscle relaxants when severe  PERTINENT HISTORY:  anxiety, GERD, HLD, h/o R knee scope  PRECAUTIONS: None  RED FLAGS: None  WEIGHT BEARING RESTRICTIONS: No  FALLS:  Has patient fallen in last 6 months?  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 Lower lumbar facet osteoarthritis. 2 mm of retrolisthesis at L3-4.  PATIENT SURVEYS:  Modified Oswestry 12 / 50 = 24.0 %   SCREENING FOR RED FLAGS: Bowel or bladder incontinence: No Spinal tumors: No Cauda equina syndrome: No Compression fracture: No Abdominal aneurysm: No  COGNITION:  Overall cognitive status: Within functional limits for tasks assessed    SENSATION: WFL  MUSCLE LENGTH: Hamstrings: Mild tight B  ITB: Mild tight B Piriformis: Mild tight R>L Hip flexors: Mild/mod tight B Quads: Mild tight B  POSTURE:  No Significant postural limitations  PALPATION: Increased muscle tension/taut bands along B thoracolumbar paraspinals > QL/psoas but pt  denies significant TTP  LUMBAR ROM:   Active  Eval  Flexion WNL  Extension WNL  Right lateral flexion WNL  Left lateral flexion WNL  Right rotation WNL  Left rotation WNL  (Blank rows = not tested)  LOWER EXTREMITY ROM:    Grossly WFL/WNL  LOWER EXTREMITY MMT:    MMT Right eval Left eval  Hip flexion 5 5  Hip extension 4 4+  Hip abduction 5 5  Hip adduction 5 5  Hip internal rotation 5 5  Hip external rotation 4 4  Knee flexion 5 5  Knee extension 5 5  Ankle dorsiflexion 5 5  Ankle plantarflexion    Ankle inversion    Ankle eversion     (Blank rows = not tested)  LUMBAR SPECIAL TESTS:  Straight leg raise test: Negative, Slump test: Negative, and FABER test: Negative   TODAY'S TREATMENT:  11/20/22 THERAPEUTIC EXERCISE: to improve flexibility, strength and mobility.  Demonstration, verbal and tactile cues throughout for technique. Bike L2x76min Seated hip hinge HS x 30"  Supine HS stretch with strap x 30" Supine SKTC opp LE straight x 30" LTR reviewed Child pose 3 way x 30 sec each way Ab set in hooklying 10x5"- cues for breathing Alt LE raise and lower x 10 bil Bridge with GTB hip ABD 10x5" 11/15/22 - Eval THERAPEUTIC EXERCISE: to improve flexibility, strength and mobility.  Demonstration, verbal and tactile cues throughout for technique. Seated hip hinge HS x 30" - cues to keep back straight and bend forward at hips Supine HS stretch with strap x 30" - cues to keep knee straight and avoid rounding back SKTC stretch with opposite LE straight to maintain neutral spine x 30" Hooklying LTR 3 x 10" 3-way child's pose stretch x 30" each   PATIENT EDUCATION:  Education details: PT eval findings, anticipated POC, initial HEP, and role of DN Person educated: Patient Education method: Explanation, Demonstration, Verbal cues, and MedBridgeGO access code emailed to patient Education comprehension: verbalized understanding, returned demonstration, verbal cues required,  and needs further education  HOME EXERCISE PROGRAM: *Access Code: [email or web address] URL: [email or web address] Date: 11/20/2022 Prepared by: Verta Ellen  Exercises - Seated Hamstring Stretch  - 2 x daily -  7 x weekly - 3 reps - 30 sec hold - Supine Hamstring Stretch with Strap  - 2 x daily - 7 x weekly - 3 reps - 30 sec hold - Single Knee to Chest Stretch  - 2 x daily - 7 x weekly - 3 reps - 30 sec hold - Supine Lower Trunk Rotation  - 2 x daily - 7 x weekly - 2 sets - 5 reps - 10-15 sec hold - Child's Pose Stretch  - 2 x daily - 7 x weekly - 3 reps - 30 sec hold - Child's Pose with Sidebending  - 2 x daily - 7 x weekly - 2-3 reps - 30 sec hold - Supine 90/90 Alternating Toe Touch  - 1 x daily - 7 x weekly - 2 sets - 10 reps - Supine Bridge with Resistance Band  - 1 x daily - 7 x weekly - 2 sets - 10 reps  Patient Education - Trigger Point Dry Needling   ASSESSMENT:  CLINICAL IMPRESSION: AMAYA BADERTSCHER showed a good response to treatment. He needs cues with hamstring stretch to keep knee extended. Cues with strengthening exercises to keep core engaged. Otherwise pt showed good demo of HEP and progressed exercises. Stevyn "Michele Mcalpine" will benefit from skilled PT to address above deficits to improve mobility and activity tolerance with decreased pain interference.   OBJECTIVE IMPAIRMENTS: decreased activity tolerance, decreased knowledge of condition, decreased mobility, decreased strength, hypomobility, increased fascial restrictions, impaired perceived functional ability, increased muscle spasms, impaired flexibility, improper body mechanics, postural dysfunction, and pain.   ACTIVITY LIMITATIONS: lifting, bending, sleeping, transfers, and bed mobility  PARTICIPATION LIMITATIONS: community activity and recreational activities and gym workouts  PERSONAL FACTORS: Past/current experiences, Time since onset of injury/illness/exacerbation, and 3+ comorbidities: anxiety, GERD, HLD, h/o  R knee scope  are also affecting patient's functional outcome.   REHAB POTENTIAL: Excellent  CLINICAL DECISION MAKING: Stable/uncomplicated  EVALUATION COMPLEXITY: Low   GOALS: Goals reviewed with patient? Yes  SHORT TERM GOALS: Target date: 12/06/2022  Patient will be independent with initial HEP to improve outcomes and carryover.  Baseline: HEP provided on eval Goal status: INITIAL  2.  Patient will 25% reduction in low back pain upon transitional motions.  Baseline: 8/10 Goal status: INITIAL  LONG TERM GOALS: Target date: 12/27/2022  Patient will be independent with ongoing/advanced HEP for self-management at home.  Baseline:  Goal status: INITIAL  2.  Patient will report 50-75% improvement in low back pain during transition movements supine to sit or rolling over in bed to improve QOL.  Baseline: 8/10 Goal status: INITIAL  3.  Patient to demonstrate ability to achieve and maintain good spinal alignment/posturing and body mechanics needed for daily activities. Baseline:  Goal status: INITIAL  4.  Patient will demonstrate improved B LE strength to 5/5 for improved stability and ease of mobility . Baseline: Refer to above LE MMT table Goal status: INITIAL  5. Patient will report </= 12% on Modified Oswestry to demonstrate improved functional ability with decreased pain interference. Baseline: 12 / 50 = 24.0 % Goal status: INITIAL  6.  Patient will will be able to get up from bed or supine workouts without triggering muscle spasm in ow back for improved mobility and activity tolerance. Baseline:  Goal status: INITIAL  7.  Patient will report no sleep disturbance due to pain when repositioning or turning over in bed. Baseline:  Goal status: INITIAL   PLAN:  PT FREQUENCY: 1-2x/week  PT DURATION: 8  weeks  PLANNED INTERVENTIONS: Therapeutic exercises, Therapeutic activity, Neuromuscular re-education, Balance training, Patient/Family education, Self Care, Joint  mobilization, Dry Needling, Electrical stimulation, Spinal manipulation, Spinal mobilization, Cryotherapy, Moist heat, Taping, Traction, Ultrasound, Manual therapy, and Re-evaluation  PLAN FOR NEXT SESSION: progress lumbopelvic flexibility, ROM and strengthening; provide education in proper body mechanics for transitional motions and daily activities; MT +/- DN to address abnormal muscle tension in lumbar paraspinals, QL and psoas muscles; modalities as needed for pain management   Darleene Cleaver, PTA 11/20/2022, 12:19 PM

## 2022-11-21 ENCOUNTER — Encounter: Payer: Self-pay | Admitting: Sports Medicine

## 2022-11-21 ENCOUNTER — Ambulatory Visit (INDEPENDENT_AMBULATORY_CARE_PROVIDER_SITE_OTHER): Payer: Medicare Other | Admitting: Sports Medicine

## 2022-11-21 VITALS — BP 134/80 | Ht 72.0 in | Wt 246.0 lb

## 2022-11-21 DIAGNOSIS — M545 Low back pain, unspecified: Secondary | ICD-10-CM | POA: Diagnosis present

## 2022-11-21 NOTE — Progress Notes (Signed)
Subjective:    Patient ID: George Love, male    DOB: 02/21/1955, 67 y.o.   MRN: [id number]  HPI chief complaint low back pain  Patient is a very pleasant 67 year old male that presents today with 5 to 6 months of low back pain.  He describes daily episodes of spasms that occur primarily when he goes from a horizontal position to sitting or standing.  Each episode lasts only a few seconds but it is extremely uncomfortable.  Once he is ambulatory he is fine.  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  He is questioning whether or not this may be contributing to his symptoms.  He was recently referred to physical therapy and has seen them only a couple of times.  X-rays of his lumbar spine done earlier this year in July showed some mild disc space narrowing at L5-S1 as well as lower lumbar facet arthropathy.  He denies radiating pain into the legs.  He has had neuropathic pain in the past and denies similar feelings with his current back pain.  No trauma.  No prior back surgeries.  Past medical history reviewed Medications reviewed Allergies reviewed  Review of Systems As above    Objective:   Physical Exam  Well-developed, well-nourished.  No acute distress  Lumbar spine: Good lumbar range of motion.  No spasm.  No focal neurological deficit of either lower extremity.      Assessment & Plan:   Low back pain and spasm likely secondary to facet arthropathy  I would like for the patient to continue with physical therapy until ready for discharge to home exercise program.  I do think he should avoid any sort of exercise both in physical therapy and in the gym that causes lumbar hyperextension.  We had a long discussion about his weight training program.  I have recommended that he keep his ribs between 10-15 on each exercise.  He also should consider hiring a Systems analyst for a short period of time just to ensure that his form is  accurate and he is doing his exercises appropriately.  If symptoms worsen down the road and consider further diagnostic imaging at that time.  Follow-up for ongoing or recalcitrant issues.  This note was dictated using Dragon naturally speaking software and may contain errors in syntax, spelling, or content which have not been identified prior to signing this note.

## 2022-11-22 ENCOUNTER — Encounter: Payer: 59 | Admitting: Physician Assistant

## 2022-11-23 ENCOUNTER — Ambulatory Visit (HOSPITAL_COMMUNITY): Payer: No Typology Code available for payment source

## 2022-11-23 ENCOUNTER — Encounter (HOSPITAL_COMMUNITY): Payer: Self-pay

## 2022-11-26 ENCOUNTER — Encounter: Payer: Self-pay | Admitting: Physical Therapy

## 2022-11-26 ENCOUNTER — Ambulatory Visit: Payer: No Typology Code available for payment source | Admitting: Physical Therapy

## 2022-11-26 DIAGNOSIS — M6281 Muscle weakness (generalized): Secondary | ICD-10-CM

## 2022-11-26 DIAGNOSIS — M5459 Other low back pain: Secondary | ICD-10-CM | POA: Diagnosis not present

## 2022-11-26 DIAGNOSIS — R2689 Other abnormalities of gait and mobility: Secondary | ICD-10-CM

## 2022-11-26 DIAGNOSIS — M62830 Muscle spasm of back: Secondary | ICD-10-CM

## 2022-11-26 NOTE — Therapy (Signed)
OUTPATIENT PHYSICAL THERAPY THORACOLUMBAR TREATMENT   Patient Name: George Love MRN: [id number] DOB:[birth year], 67 y.o., male Today's Date: 11/26/2022  END OF SESSION:  PT End of Session - 11/26/22 0803     Visit Number 3    Date for PT Re-Evaluation 12/27/22    Authorization Type [facility] [employer]    PT Start Time 0803    PT Stop Time 0852    PT Time Calculation (min) 49 min    Activity Tolerance Patient tolerated treatment well    Behavior During Therapy WFL for tasks assessed/performed               Past Medical History:  Diagnosis Date   Anxiety    GERD (gastroesophageal reflux disease)    Hyperlipidemia    Past Surgical History:  Procedure Laterality Date   ESOPHAGOGASTRODUODENOSCOPY (EGD) WITH PROPOFOL N/A 08/14/2022   Procedure: ESOPHAGOGASTRODUODENOSCOPY (EGD) WITH PROPOFOL;  Surgeon: Vida Rigger, MD;  Location: [facility];  Service: Gastroenterology;  Laterality: N/A;   KNEE ARTHROSCOPY Right    POLYPECTOMY  08/14/2022   Procedure: POLYPECTOMY;  Surgeon: Vida Rigger, MD;  Location: [facility];  Service: Gastroenterology;;   PROSTATE SURGERY     Patient Active Problem List   Diagnosis Date Noted   urinary urgency 11/01/2021   Elevated PSA 10/28/2019    PCP: Marisue Brooklyn   REFERRING PROVIDER: Esperanza Richters, PA-C   REFERRING DIAG: M54.50 (ICD-10-CM) - Midline low back pain without sciatica, unspecified chronicity   THERAPY DIAG:  Other low back pain  Muscle spasm of back  Muscle weakness (generalized)  Other abnormalities of gait and mobility  RATIONALE FOR EVALUATION AND TREATMENT: Rehabilitation  ONSET DATE: ~6 months  NEXT MD VISIT: 11/16/22 & 09/05/23   SUBJECTIVE:                                                                                                                                                                                                         SUBJECTIVE STATEMENT: Pt denies any improvement thus far. Sitting  up from lying down is still the worst.   PAIN: Are you having pain? Yes: NPRS scale: 0/10 Pain location: midline & bilateral low back in upper lumbar spine (near thoracolumbar junction) Pain description: muscle spasm/cramping Aggravating factors: going to get up from laying down Relieving factors: muscle relaxants when severe  PERTINENT HISTORY:  anxiety, GERD, HLD, h/o R knee scope  PRECAUTIONS: None  RED FLAGS: None  WEIGHT BEARING RESTRICTIONS: No  FALLS:  Has patient fallen in last 6 months? 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 Lower lumbar facet osteoarthritis. 2 mm of retrolisthesis at L3-4.  PATIENT SURVEYS:  Modified Oswestry 12 / 50 = 24.0 %   SCREENING FOR RED FLAGS: Bowel or bladder incontinence: No Spinal tumors: No Cauda equina syndrome: No Compression fracture: No Abdominal aneurysm: No  COGNITION:  Overall cognitive status: Within functional limits for tasks assessed    SENSATION: WFL  MUSCLE LENGTH: Hamstrings: Mild tight B  ITB: Mild tight B Piriformis: Mild tight R>L Hip flexors: Mild/mod tight B Quads: Mild tight B  POSTURE:  No Significant postural limitations  PALPATION: Increased muscle tension/taut bands along B thoracolumbar paraspinals > QL/psoas but pt denies significant TTP  LUMBAR  ROM:   Active  Eval  Flexion WNL  Extension WNL  Right lateral flexion WNL  Left lateral flexion WNL  Right rotation WNL  Left rotation WNL  (Blank rows = not tested)  LOWER EXTREMITY ROM:    Grossly WFL/WNL  LOWER EXTREMITY MMT:    MMT Right eval Left eval  Hip flexion 5 5  Hip extension 4 4+  Hip abduction 5 5  Hip adduction 5 5  Hip internal rotation 5 5  Hip external rotation 4 4  Knee flexion 5 5  Knee extension 5 5  Ankle dorsiflexion 5 5  Ankle plantarflexion    Ankle inversion    Ankle eversion     (Blank rows = not tested)  LUMBAR SPECIAL TESTS:  Straight leg raise test: Negative, Slump test: Negative, and FABER test: Negative   TODAY'S TREATMENT:   11/26/22 THERAPEUTIC EXERCISE: to improve flexibility, strength and mobility.  Demonstration, verbal and tactile cues throughout for technique.  UBE: L2.0 x 6 min (3' each fwd & back) Brief review of gym weigh machines and proper alignment and body mechanics when performing exercises  THERAPEUTIC ACTIVITIES: Provided education in proper posture and body mechanics for typical daily positioning and household chores to minimize strain on low back.  MANUAL THERAPY: To promote normalized muscle tension, improved flexibility, and reduced pain. Skilled palpation and monitoring of soft tissue during DN Trigger Point Dry-Needling  Treatment instructions: Expect mild to moderate muscle soreness. S/S of pneumothorax if dry needled over a lung field, and to seek immediate medical attention should they occur. Patient verbalized understanding of these instructions and education. Patient Consent Given: Yes Education handout provided: Previously provided Muscles treated: B lumbar multifidi & L erector spinae Electrical stimulation performed: No Parameters: N/A Treatment response/outcome: Twitch Response Elicited and Palpable Increase in Muscle Length STM/DTM, manual TPR and pin & stretch to muscles addressed with  DN   11/20/22 THERAPEUTIC EXERCISE: to improve flexibility, strength and mobility.  Demonstration, verbal and tactile cues throughout for technique. Bike L2x59min Seated hip hinge HS x 30"  Supine HS stretch with strap x 30" Supine SKTC opp LE straight x 30" LTR reviewed Child pose 3 way x 30 sec  each way Ab set in hooklying 10x5"- cues for breathing Alt LE raise and lower x 10 bil Bridge with GTB hip ABD 10x5"   11/15/22 - Eval THERAPEUTIC EXERCISE: to improve flexibility, strength and mobility.  Demonstration, verbal and tactile cues throughout for technique. Seated hip hinge HS x 30" - cues to keep back straight and bend forward at hips Supine HS stretch with strap x 30" - cues to keep knee straight and avoid rounding back SKTC stretch with opposite LE straight to maintain neutral spine x 30" Hooklying LTR 3 x 10" 3-way child's pose stretch x 30" each   PATIENT EDUCATION:  Education details: role of DN and DN rational, procedure, outcomes, potential side effects, and recommended post-treatment exercises/activity Person educated: Patient Education method: Explanation and DN handout included in MedBridgeGO access code Education comprehension: verbalized understanding  HOME EXERCISE PROGRAM: *Access Code: [email or web address] URL: [email or web address] Date: 11/20/2022 Prepared by: Verta Ellen  Exercises - Seated Hamstring Stretch  - 2 x daily - 7 x weekly - 3 reps - 30 sec hold - Supine Hamstring Stretch with Strap  - 2 x daily - 7 x weekly - 3 reps - 30 sec hold - Single Knee to Chest Stretch  - 2 x daily - 7 x weekly - 3 reps - 30 sec hold - Supine Lower Trunk Rotation  - 2 x daily - 7 x weekly - 2 sets - 5 reps - 10-15 sec hold - Child's Pose Stretch  - 2 x daily - 7 x weekly - 3 reps - 30 sec hold - Child's Pose with Sidebending  - 2 x daily - 7 x weekly - 2-3 reps - 30 sec hold - Supine 90/90 Alternating Toe Touch  - 1 x daily - 7 x weekly - 2 sets - 10 reps -  Supine Bridge with Resistance Band  - 1 x daily - 7 x weekly - 2 sets - 10 reps  Patient Education - Trigger Point Dry Needling   ASSESSMENT:  CLINICAL IMPRESSION: Isak Mondschein" reports the sports medicine MD told him his pain is likely due to (facet) arthritis in his lumbar spine and recommended against any exercise that cause lumber hyperextension. We briefly reviewed gym based weight machines focusing on proper alignment and body mechanics to maintain neutral lumbar spine.  Education also provided for proper posture and body mechanics with typical daily positioning, mobility and household chores to minimize strain on low back. He continues to demonstrate increased muscle tension/tightness L>R in mid to upper lumbar paraspinals which appeared amenable to DN.  After explanation of DN rational, procedures, outcomes and potential side effects, patient verbalized consent to DN treatment in conjunction with manual STM/DTM and TPR to reduce ttp/muscle tension. Muscles treated as indicated above. DN produced normal response with good twitches elicited resulting in palpable reduction in pain/ttp and muscle tension. Pt educated to expect mild to moderate muscle soreness for up to 24-48 hrs and instructed to continue prescribed HEP and current activity level with pt verbalizing understanding of these instructions.  Michele Mcalpine will benefit from continued skilled PT to address above deficits to improve mobility and activity tolerance with decreased pain interference.   OBJECTIVE IMPAIRMENTS: decreased activity tolerance, decreased knowledge of condition, decreased mobility, decreased strength, hypomobility, increased fascial restrictions, impaired perceived functional ability, increased muscle spasms, impaired flexibility, improper body mechanics, postural dysfunction, and pain.   ACTIVITY LIMITATIONS: lifting, bending, sleeping, transfers, and bed mobility  PARTICIPATION LIMITATIONS: community activity and  recreational activities and gym  workouts  PERSONAL FACTORS: Past/current experiences, Time since onset of injury/illness/exacerbation, and 3+ comorbidities: anxiety, GERD, HLD, h/o R knee scope  are also affecting patient's functional outcome.   REHAB POTENTIAL: Excellent  CLINICAL DECISION MAKING: Stable/uncomplicated  EVALUATION COMPLEXITY: Low   GOALS: Goals reviewed with patient? Yes  SHORT TERM GOALS: Target date: 12/06/2022  Patient will be independent with initial HEP to improve outcomes and carryover.  Baseline: HEP provided on eval Goal status: IN PROGRESS  2.  Patient will 25% reduction in low back pain upon transitional motions.  Baseline: 8/10 Goal status: IN PROGRESS  LONG TERM GOALS: Target date: 12/27/2022  Patient will be independent with ongoing/advanced HEP for self-management at home.  Baseline:  Goal status: IN PROGRESS  2.  Patient will report 50-75% improvement in low back pain during transition movements supine to sit or rolling over in bed to improve QOL.  Baseline: 8/10 Goal status: IN PROGRESS  3.  Patient to demonstrate ability to achieve and maintain good spinal alignment/posturing and body mechanics needed for daily activities. Baseline:  Goal status: IN PROGRESS  4.  Patient will demonstrate improved B LE strength to 5/5 for improved stability and ease of mobility . Baseline: Refer to above LE MMT table Goal status: IN PROGRESS  5. Patient will report </= 12% on Modified Oswestry to demonstrate improved functional ability with decreased pain interference. Baseline: 12 / 50 = 24.0 % Goal status: IN PROGRESS  6.  Patient will will be able to get up from bed or supine workouts without triggering muscle spasm in ow back for improved mobility and activity tolerance. Baseline:  Goal status: IN PROGRESS  7.  Patient will report no sleep disturbance due to pain when repositioning or turning over in bed. Baseline:  Goal status: IN  PROGRESS   PLAN:  PT FREQUENCY: 1-2x/week  PT DURATION: 8 weeks  PLANNED INTERVENTIONS: Therapeutic exercises, Therapeutic activity, Neuromuscular re-education, Balance training, Patient/Family education, Self Care, Joint mobilization, Dry Needling, Electrical stimulation, Spinal manipulation, Spinal mobilization, Cryotherapy, Moist heat, Taping, Traction, Ultrasound, Manual therapy, and Re-evaluation  PLAN FOR NEXT SESSION: Assess response to DN; progress lumbopelvic flexibility, ROM and strengthening; provide education in proper body mechanics for transitional motions and daily activities; MT +/- DN to address abnormal muscle tension in lumbar paraspinals, QL and psoas muscles; modalities as needed for pain management; possible trial of Ktape for thoracolumbar paraspinals   Marry Guan, PT 11/26/2022, 9:04 AM

## 2022-11-28 ENCOUNTER — Ambulatory Visit: Payer: No Typology Code available for payment source

## 2022-11-28 DIAGNOSIS — M62830 Muscle spasm of back: Secondary | ICD-10-CM

## 2022-11-28 DIAGNOSIS — M6281 Muscle weakness (generalized): Secondary | ICD-10-CM

## 2022-11-28 DIAGNOSIS — M5459 Other low back pain: Secondary | ICD-10-CM

## 2022-11-28 DIAGNOSIS — R2689 Other abnormalities of gait and mobility: Secondary | ICD-10-CM

## 2022-11-28 NOTE — Therapy (Signed)
OUTPATIENT PHYSICAL THERAPY THORACOLUMBAR TREATMENT   Patient Name: George Love MRN: [id number] DOB:1955/03/10, 67 y.o., male Today's Date: 11/28/2022  END OF SESSION:  PT End of Session - 11/28/22 0803     Visit Number 4    Date for PT Re-Evaluation 12/27/22    Authorization Type [facility] [employer]    PT Start Time [phone]    PT Stop Time 0845    PT Time Calculation (min) 46 min    Activity Tolerance Patient tolerated treatment well    Behavior During Therapy WFL for tasks assessed/performed                Past Medical History:  Diagnosis Date   Anxiety    GERD (gastroesophageal reflux disease)    Hyperlipidemia    Past Surgical History:  Procedure Laterality Date   ESOPHAGOGASTRODUODENOSCOPY (EGD) WITH PROPOFOL N/A 08/14/2022   Procedure: ESOPHAGOGASTRODUODENOSCOPY (EGD) WITH PROPOFOL;  Surgeon: Vida Rigger, MD;  Location: [facility];  Service: Gastroenterology;  Laterality: N/A;   KNEE ARTHROSCOPY Right    POLYPECTOMY  08/14/2022   Procedure: POLYPECTOMY;  Surgeon: Vida Rigger, MD;  Location: [facility];  Service: Gastroenterology;;   PROSTATE SURGERY     Patient Active Problem List   Diagnosis Date Noted   urinary urgency 11/01/2021   Elevated PSA 10/28/2019    PCP: Marisue Brooklyn   REFERRING PROVIDER: Esperanza Richters, PA-C   REFERRING DIAG: M54.50 (ICD-10-CM) - Midline low back pain without sciatica, unspecified chronicity   THERAPY DIAG:  Other low back pain  Muscle spasm of back  Muscle weakness (generalized)  Other abnormalities of gait and mobility  RATIONALE FOR EVALUATION AND TREATMENT: Rehabilitation  ONSET DATE: ~6 months  NEXT MD VISIT: 11/16/22 & 09/05/23   SUBJECTIVE:                                                                                                                                                                                                         SUBJECTIVE STATEMENT: Pt reports good relief from DN, does report  that his back feels a little tight this morning though.  PAIN: Are you having pain? Yes: NPRS scale: 0/10 Pain location: midline & bilateral low back in upper lumbar spine (near thoracolumbar junction) Pain description: muscle spasm/cramping Aggravating factors: going to get up from laying down Relieving factors: muscle relaxants when severe  PERTINENT HISTORY:  anxiety, GERD, HLD, h/o R knee scope  PRECAUTIONS: None  RED FLAGS: None  WEIGHT BEARING RESTRICTIONS: No  FALLS:  Has patient fallen in last 6 months? No  LIVING ENVIRONMENT: Lives with: lives with their family and lives with their spouse Lives in: House/apartment Stairs: Yes: Internal: 12 steps; on left going up and External: 4 steps; on right going up Has following equipment at home: None  OCCUPATION: FT Environmental education officer [employer]  PLOF: Independent and Leisure: working out ~4x/wk (cardio - elliptical, weight circuit training, stretches and planks)    PATIENT GOALS: "To be able to lay down and get up without the muscle cramps."   OBJECTIVE: (objective measures completed at initial evaluation unless otherwise dated)  DIAGNOSTIC FINDINGS:  09/04/22 - DG Lumbar Spine: IMPRESSION:  Mild disc space narrowing at L5-S1. Lower lumbar facet osteoarthritis. 2 mm of retrolisthesis at L3-4.  PATIENT SURVEYS:  Modified Oswestry 12 / 50 = 24.0 %   SCREENING FOR RED FLAGS: Bowel or bladder incontinence: No Spinal tumors: No Cauda equina syndrome: No Compression fracture: No Abdominal aneurysm: No  COGNITION:  Overall cognitive status: Within functional limits for tasks assessed    SENSATION: WFL  MUSCLE LENGTH: Hamstrings: Mild tight B  ITB: Mild tight B Piriformis: Mild tight R>L Hip flexors: Mild/mod tight B Quads: Mild tight B  POSTURE:  No Significant postural limitations  PALPATION: Increased muscle tension/taut bands along B thoracolumbar paraspinals > QL/psoas but pt denies  significant TTP  LUMBAR ROM:   Active  Eval  Flexion WNL  Extension WNL  Right lateral flexion WNL  Left lateral flexion WNL  Right rotation WNL  Left rotation WNL  (Blank rows = not tested)  LOWER EXTREMITY ROM:    Grossly WFL/WNL  LOWER EXTREMITY MMT:    MMT Right eval Left eval  Hip flexion 5 5  Hip extension 4 4+  Hip abduction 5 5  Hip adduction 5 5  Hip internal rotation 5 5  Hip external rotation 4 4  Knee flexion 5 5  Knee extension 5 5  Ankle dorsiflexion 5 5  Ankle plantarflexion    Ankle inversion    Ankle eversion     (Blank rows = not tested)  LUMBAR SPECIAL TESTS:  Straight leg raise test: Negative, Slump test: Negative, and FABER test: Negative   TODAY'S TREATMENT:  11/28/22 THERAPEUTIC EXERCISE: to improve flexibility, strength and mobility.  Demonstration, verbal and tactile cues throughout for technique.  Bike L5x11min Lat pulls 55# 2x15 - cues to keep shoulders down Standing shld ext 20# 2x10- cues to keep elbows straight Standing pallof press 5# x 15 each direction Seated rows 35# 2x15 HS curls 25# x 15 BLE Knee extension 15# x 15 BLE  11/26/22 THERAPEUTIC EXERCISE: to improve flexibility, strength and mobility.  Demonstration, verbal and tactile cues throughout for technique.  UBE: L2.0 x 6 min (3' each fwd & back) Brief review of gym weigh machines and proper alignment and body mechanics when performing exercises  THERAPEUTIC ACTIVITIES: Provided education in proper posture and body mechanics for typical daily positioning and household chores to minimize strain on low back.  MANUAL THERAPY: To promote normalized muscle tension, improved flexibility, and reduced pain. Skilled palpation and monitoring of soft tissue during DN Trigger Point Dry-Needling  Treatment instructions: Expect mild to moderate muscle soreness. S/S of pneumothorax if dry needled over a lung field, and to seek immediate medical attention should they occur. Patient  verbalized understanding of these instructions and education. Patient Consent Given: Yes Education handout provided: Previously provided Muscles treated: B lumbar multifidi & L erector spinae Electrical stimulation performed: No Parameters: N/A Treatment response/outcome: Twitch Response Elicited and  Palpable Increase in Muscle Length STM/DTM, manual TPR and pin & stretch to muscles addressed with DN   11/20/22 THERAPEUTIC EXERCISE: to improve flexibility, strength and mobility.  Demonstration, verbal and tactile cues throughout for technique. Bike L2x74min Seated hip hinge HS x 30"  Supine HS stretch with strap x 30" Supine SKTC opp LE straight x 30" LTR reviewed Child pose 3 way x 30 sec each way Ab set in hooklying 10x5"- cues for breathing Alt LE raise and lower x 10 bil Bridge with GTB hip ABD 10x5"   11/15/22 - Eval THERAPEUTIC EXERCISE: to improve flexibility, strength and mobility.  Demonstration, verbal and tactile cues throughout for technique. Seated hip hinge HS x 30" - cues to keep back straight and bend forward at hips Supine HS stretch with strap x 30" - cues to keep knee straight and avoid rounding back SKTC stretch with opposite LE straight to maintain neutral spine x 30" Hooklying LTR 3 x 10" 3-way child's pose stretch x 30" each   PATIENT EDUCATION:  Education details: role of DN and DN rational, procedure, outcomes, potential side effects, and recommended post-treatment exercises/activity Person educated: Patient Education method: Explanation and DN handout included in [employer]Bridge[employer] access code Education comprehension: verbalized understanding  HOME EXERCISE PROGRAM: *Access Code: [email or web address] URL: [email or web address] Date: 11/20/2022 Prepared by: Verta Ellen  Exercises - Seated Hamstring Stretch  - 2 x daily - 7 x weekly - 3 reps - 30 sec hold - Supine Hamstring Stretch with Strap  - 2 x daily - 7 x weekly - 3 reps - 30 sec hold - Single  Knee to Chest Stretch  - 2 x daily - 7 x weekly - 3 reps - 30 sec hold - Supine Lower Trunk Rotation  - 2 x daily - 7 x weekly - 2 sets - 5 reps - 10-15 sec hold - Child's Pose Stretch  - 2 x daily - 7 x weekly - 3 reps - 30 sec hold - Child's Pose with Sidebending  - 2 x daily - 7 x weekly - 2-3 reps - 30 sec hold - Supine 90/90 Alternating Toe Touch  - 1 x daily - 7 x weekly - 2 sets - 10 reps - Supine Bridge with Resistance Band  - 1 x daily - 7 x weekly - 2 sets - 10 reps  Patient Education - Trigger Point Dry Needling   ASSESSMENT:  CLINICAL IMPRESSION: Reviewed gym machines for safety and correct technique to target the appropriate muscles. Educated patient on avoiding heavy weights and being aware of his form with the exercises. Most of the session was education and reviewing gym routine with machines.  George Love will benefit from continued skilled PT to address above deficits to improve mobility and activity tolerance with decreased pain interference.   OBJECTIVE IMPAIRMENTS: decreased activity tolerance, decreased knowledge of condition, decreased mobility, decreased strength, hypomobility, increased fascial restrictions, impaired perceived functional ability, increased muscle spasms, impaired flexibility, improper body mechanics, postural dysfunction, and pain.   ACTIVITY LIMITATIONS: lifting, bending, sleeping, transfers, and bed mobility  PARTICIPATION LIMITATIONS: community activity and recreational activities and gym workouts  PERSONAL FACTORS: Past/current experiences, Time since onset of injury/illness/exacerbation, and 3+ comorbidities: anxiety, GERD, HLD, h/o R knee scope  are also affecting patient's functional outcome.   REHAB POTENTIAL: Excellent  CLINICAL DECISION MAKING: Stable/uncomplicated  EVALUATION COMPLEXITY: Low   GOALS: Goals reviewed with patient? Yes  SHORT TERM GOALS: Target date: 12/06/2022  Patient will be independent with initial HEP to  improve  outcomes and carryover.  Baseline: HEP provided on eval Goal status: MET- 11/28/22  2.  Patient will 25% reduction in low back pain upon transitional motions.  Baseline: 8/10 Goal status: IN PROGRESS  LONG TERM GOALS: Target date: 12/27/2022  Patient will be independent with ongoing/advanced HEP for self-management at home.  Baseline:  Goal status: IN PROGRESS  2.  Patient will report 50-75% improvement in low back pain during transition movements supine to sit or rolling over in bed to improve QOL.  Baseline: 8/10 Goal status: IN PROGRESS  3.  Patient to demonstrate ability to achieve and maintain good spinal alignment/posturing and body mechanics needed for daily activities. Baseline:  Goal status: IN PROGRESS  4.  Patient will demonstrate improved B LE strength to 5/5 for improved stability and ease of mobility . Baseline: Refer to above LE MMT table Goal status: IN PROGRESS  5. Patient will report </= 12% on Modified Oswestry to demonstrate improved functional ability with decreased pain interference. Baseline: 12 / 50 = 24.0 % Goal status: IN PROGRESS  6.  Patient will will be able to get up from bed or supine workouts without triggering muscle spasm in ow back for improved mobility and activity tolerance. Baseline:  Goal status: IN PROGRESS  7.  Patient will report no sleep disturbance due to pain when repositioning or turning over in bed. Baseline:  Goal status: IN PROGRESS   PLAN:  PT FREQUENCY: 1-2x/week  PT DURATION: 8 weeks  PLANNED INTERVENTIONS: Therapeutic exercises, Therapeutic activity, Neuromuscular re-education, Balance training, Patient/Family education, Self Care, Joint mobilization, Dry Needling, Electrical stimulation, Spinal manipulation, Spinal mobilization, Cryotherapy, Moist heat, Taping, Traction, Ultrasound, Manual therapy, and Re-evaluation  PLAN FOR NEXT SESSION: progress lumbopelvic flexibility, ROM and strengthening; provide education in  proper body mechanics for transitional motions and daily activities; MT +/- DN to address abnormal muscle tension in lumbar paraspinals, QL and psoas muscles; modalities as needed for pain management; possible trial of Ktape for thoracolumbar paraspinals   Sathvika Ojo L Evangela Heffler, PTA 11/28/2022, 8:45 AM

## 2022-11-29 ENCOUNTER — Encounter: Payer: 59 | Admitting: Physical Therapy

## 2022-12-03 ENCOUNTER — Ambulatory Visit: Payer: No Typology Code available for payment source

## 2022-12-03 DIAGNOSIS — M6281 Muscle weakness (generalized): Secondary | ICD-10-CM

## 2022-12-03 DIAGNOSIS — R2689 Other abnormalities of gait and mobility: Secondary | ICD-10-CM

## 2022-12-03 DIAGNOSIS — M5459 Other low back pain: Secondary | ICD-10-CM

## 2022-12-03 DIAGNOSIS — M62830 Muscle spasm of back: Secondary | ICD-10-CM

## 2022-12-03 NOTE — Therapy (Signed)
OUTPATIENT PHYSICAL THERAPY THORACOLUMBAR TREATMENT   Patient Name: George Love MRN: [id number] DOB:Oct 28, 1955, 67 y.o., male Today's Date: 12/03/2022  END OF SESSION:  PT End of Session - 12/03/22 0823     Visit Number 5    Date for PT Re-Evaluation 12/27/22    Authorization Type [facility] [employer]    PT Start Time 0803    PT Stop Time 0846    PT Time Calculation (min) 43 min    Activity Tolerance Patient tolerated treatment well    Behavior During Therapy WFL for tasks assessed/performed                 Past Medical History:  Diagnosis Date   Anxiety    GERD (gastroesophageal reflux disease)    Hyperlipidemia    Past Surgical History:  Procedure Laterality Date   ESOPHAGOGASTRODUODENOSCOPY (EGD) WITH PROPOFOL N/A 08/14/2022   Procedure: ESOPHAGOGASTRODUODENOSCOPY (EGD) WITH PROPOFOL;  Surgeon: Vida Rigger, MD;  Location: [facility];  Service: Gastroenterology;  Laterality: N/A;   KNEE ARTHROSCOPY Right    POLYPECTOMY  08/14/2022   Procedure: POLYPECTOMY;  Surgeon: Vida Rigger, MD;  Location: [facility];  Service: Gastroenterology;;   PROSTATE SURGERY     Patient Active Problem List   Diagnosis Date Noted   urinary urgency 11/01/2021   Elevated PSA 10/28/2019    PCP: Marisue Brooklyn   REFERRING PROVIDER: Esperanza Richters, PA-C   REFERRING DIAG: M54.50 (ICD-10-CM) - Midline low back pain without sciatica, unspecified chronicity   THERAPY DIAG:  Other low back pain  Muscle spasm of back  Muscle weakness (generalized)  Other abnormalities of gait and mobility  RATIONALE FOR EVALUATION AND TREATMENT: Rehabilitation  ONSET DATE: ~6 months  NEXT MD VISIT: 11/16/22 & 09/05/23   SUBJECTIVE:                                                                                                                                                                                                         SUBJECTIVE STATEMENT: Pt reports improved LBP, no pain  today.  PAIN: Are you having pain? Yes: NPRS scale: 0/10 Pain location: midline & bilateral low back in upper lumbar spine (near thoracolumbar junction) Pain description: muscle spasm/cramping Aggravating factors: going to get up from laying down Relieving factors: muscle relaxants when severe  PERTINENT HISTORY:  anxiety, GERD, HLD, h/o R knee scope  PRECAUTIONS: None  RED FLAGS: None  WEIGHT BEARING RESTRICTIONS: No  FALLS:  Has patient fallen in last 6 months? No  LIVING ENVIRONMENT: Lives with: lives with their family and  lives with their spouse Lives in: House/apartment Stairs: Yes: Internal: 12 steps; on left going up and External: 4 steps; on right going up Has following equipment at home: None  OCCUPATION: FT Environmental education officer [employer]  PLOF: Independent and Leisure: working out ~4x/wk (cardio - elliptical, weight circuit training, stretches and planks)    PATIENT GOALS: "To be able to lay down and get up without the muscle cramps."   OBJECTIVE: (objective measures completed at initial evaluation unless otherwise dated)  DIAGNOSTIC FINDINGS:  09/04/22 - DG Lumbar Spine: IMPRESSION:  Mild disc space narrowing at L5-S1. Lower lumbar facet osteoarthritis. 2 mm of retrolisthesis at L3-4.  PATIENT SURVEYS:  Modified Oswestry 12 / 50 = 24.0 %   SCREENING FOR RED FLAGS: Bowel or bladder incontinence: No Spinal tumors: No Cauda equina syndrome: No Compression fracture: No Abdominal aneurysm: No  COGNITION:  Overall cognitive status: Within functional limits for tasks assessed    SENSATION: WFL  MUSCLE LENGTH: Hamstrings: Mild tight B  ITB: Mild tight B Piriformis: Mild tight R>L Hip flexors: Mild/mod tight B Quads: Mild tight B  POSTURE:  No Significant postural limitations  PALPATION: Increased muscle tension/taut bands along B thoracolumbar paraspinals > QL/psoas but pt denies significant TTP  LUMBAR ROM:   Active  Eval  Flexion  WNL  Extension WNL  Right lateral flexion WNL  Left lateral flexion WNL  Right rotation WNL  Left rotation WNL  (Blank rows = not tested)  LOWER EXTREMITY ROM:    Grossly WFL/WNL  LOWER EXTREMITY MMT:    MMT Right eval Left eval  Hip flexion 5 5  Hip extension 4 4+  Hip abduction 5 5  Hip adduction 5 5  Hip internal rotation 5 5  Hip external rotation 4 4  Knee flexion 5 5  Knee extension 5 5  Ankle dorsiflexion 5 5  Ankle plantarflexion    Ankle inversion    Ankle eversion     (Blank rows = not tested)  LUMBAR SPECIAL TESTS:  Straight leg raise test: Negative, Slump test: Negative, and FABER test: Negative   TODAY'S TREATMENT:  12/03/22 THERAPEUTIC EXERCISE: to improve flexibility, strength and mobility.  Demonstration, verbal and tactile cues throughout for technique.  Eliptical L2.0 x Pallof press 10# 2x10 bil Standing shld ext 20# 2x15  Lat pulls 65# x 15 Good mornings with dowel x 10 Leg press 55# x 20 BLE  11/28/22 THERAPEUTIC EXERCISE: to improve flexibility, strength and mobility.  Demonstration, verbal and tactile cues throughout for technique.  Bike L5x79min Lat pulls 55# 2x15 - cues to keep shoulders down Standing shld ext 20# 2x10- cues to keep elbows straight Standing pallof press 5# x 15 each direction Seated rows 35# 2x15 HS curls 25# x 15 BLE Knee extension 15# x 15 BLE  11/26/22 THERAPEUTIC EXERCISE: to improve flexibility, strength and mobility.  Demonstration, verbal and tactile cues throughout for technique.  UBE: L2.0 x 6 min (3' each fwd & back) Brief review of gym weigh machines and proper alignment and body mechanics when performing exercises  THERAPEUTIC ACTIVITIES: Provided education in proper posture and body mechanics for typical daily positioning and household chores to minimize strain on low back.  MANUAL THERAPY: To promote normalized muscle tension, improved flexibility, and reduced pain. Skilled palpation and monitoring  of soft tissue during DN Trigger Point Dry-Needling  Treatment instructions: Expect mild to moderate muscle soreness. S/S of pneumothorax if dry needled over a lung field, and to seek immediate  medical attention should they occur. Patient verbalized understanding of these instructions and education. Patient Consent Given: Yes Education handout provided: Previously provided Muscles treated: B lumbar multifidi & L erector spinae Electrical stimulation performed: No Parameters: N/A Treatment response/outcome: Twitch Response Elicited and Palpable Increase in Muscle Length STM/DTM, manual TPR and pin & stretch to muscles addressed with DN   11/20/22 THERAPEUTIC EXERCISE: to improve flexibility, strength and mobility.  Demonstration, verbal and tactile cues throughout for technique. Bike L2x42min Seated hip hinge HS x 30"  Supine HS stretch with strap x 30" Supine SKTC opp LE straight x 30" LTR reviewed Child pose 3 way x 30 sec each way Ab set in hooklying 10x5"- cues for breathing Alt LE raise and lower x 10 bil Bridge with GTB hip ABD 10x5"   11/15/22 - Eval THERAPEUTIC EXERCISE: to improve flexibility, strength and mobility.  Demonstration, verbal and tactile cues throughout for technique. Seated hip hinge HS x 30" - cues to keep back straight and bend forward at hips Supine HS stretch with strap x 30" - cues to keep knee straight and avoid rounding back SKTC stretch with opposite LE straight to maintain neutral spine x 30" Hooklying LTR 3 x 10" 3-way child's pose stretch x 30" each   PATIENT EDUCATION:  Education details: role of DN and DN rational, procedure, outcomes, potential side effects, and recommended post-treatment exercises/activity Person educated: Patient Education method: Explanation and DN handout included in MedBridgeGO access code Education comprehension: verbalized understanding  HOME EXERCISE PROGRAM: *Access Code: [email or web address] URL:  [email or web address] Date: 11/20/2022 Prepared by: Verta Ellen  Exercises - Seated Hamstring Stretch  - 2 x daily - 7 x weekly - 3 reps - 30 sec hold - Supine Hamstring Stretch with Strap  - 2 x daily - 7 x weekly - 3 reps - 30 sec hold - Single Knee to Chest Stretch  - 2 x daily - 7 x weekly - 3 reps - 30 sec hold - Supine Lower Trunk Rotation  - 2 x daily - 7 x weekly - 2 sets - 5 reps - 10-15 sec hold - Child's Pose Stretch  - 2 x daily - 7 x weekly - 3 reps - 30 sec hold - Child's Pose with Sidebending  - 2 x daily - 7 x weekly - 2-3 reps - 30 sec hold - Supine 90/90 Alternating Toe Touch  - 1 x daily - 7 x weekly - 2 sets - 10 reps - Supine Bridge with Resistance Band  - 1 x daily - 7 x weekly - 2 sets - 10 reps  Patient Education - Trigger Point Dry Needling   ASSESSMENT:  CLINICAL IMPRESSION: Continued with progression of strengthening for core and lumbar spine. Focused on gym based exercises again today to allow for smooth return to workout regimen. Made clarification for exercises to target the correct muscles as needed. Michele Mcalpine will benefit from continued skilled PT to address above deficits to improve mobility and activity tolerance with decreased pain interference.   OBJECTIVE IMPAIRMENTS: decreased activity tolerance, decreased knowledge of condition, decreased mobility, decreased strength, hypomobility, increased fascial restrictions, impaired perceived functional ability, increased muscle spasms, impaired flexibility, improper body mechanics, postural dysfunction, and pain.   ACTIVITY LIMITATIONS: lifting, bending, sleeping, transfers, and bed mobility  PARTICIPATION LIMITATIONS: community activity and recreational activities and gym workouts  PERSONAL FACTORS: Past/current experiences, Time since onset of injury/illness/exacerbation, and 3+ comorbidities: anxiety, GERD, HLD, h/o R knee scope  are also affecting patient's functional outcome.  REHAB  POTENTIAL: Excellent  CLINICAL DECISION MAKING: Stable/uncomplicated  EVALUATION COMPLEXITY: Low   GOALS: Goals reviewed with patient? Yes  SHORT TERM GOALS: Target date: 12/06/2022  Patient will be independent with initial HEP to improve outcomes and carryover.  Baseline: HEP provided on eval Goal status: MET- 11/28/22  2.  Patient will 25% reduction in low back pain upon transitional motions.  Baseline: 8/10 Goal status: MET- 50% improvement 12/03/22  LONG TERM GOALS: Target date: 12/27/2022  Patient will be independent with ongoing/advanced HEP for self-management at home.  Baseline:  Goal status: IN PROGRESS  2.  Patient will report 50-75% improvement in low back pain during transition movements supine to sit or rolling over in bed to improve QOL.  Baseline: 8/10 Goal status: IN PROGRESS  3.  Patient to demonstrate ability to achieve and maintain good spinal alignment/posturing and body mechanics needed for daily activities. Baseline:  Goal status: IN PROGRESS  4.  Patient will demonstrate improved B LE strength to 5/5 for improved stability and ease of mobility . Baseline: Refer to above LE MMT table Goal status: IN PROGRESS  5. Patient will report </= 12% on Modified Oswestry to demonstrate improved functional ability with decreased pain interference. Baseline: 12 / 50 = 24.0 % Goal status: IN PROGRESS  6.  Patient will will be able to get up from bed or supine workouts without triggering muscle spasm in ow back for improved mobility and activity tolerance. Baseline:  Goal status: IN PROGRESS  7.  Patient will report no sleep disturbance due to pain when repositioning or turning over in bed. Baseline:  Goal status: IN PROGRESS   PLAN:  PT FREQUENCY: 1-2x/week  PT DURATION: 8 weeks  PLANNED INTERVENTIONS: Therapeutic exercises, Therapeutic activity, Neuromuscular re-education, Balance training, Patient/Family education, Self Care, Joint mobilization, Dry  Needling, Electrical stimulation, Spinal manipulation, Spinal mobilization, Cryotherapy, Moist heat, Taping, Traction, Ultrasound, Manual therapy, and Re-evaluation  PLAN FOR NEXT SESSION: continue to review gym equipment if needed; progress lumbopelvic flexibility, ROM and strengthening; provide education in proper body mechanics for transitional motions and daily activities; MT +/- DN to address abnormal muscle tension in lumbar paraspinals, QL and psoas muscles; modalities as needed for pain management; possible trial of Ktape for thoracolumbar paraspinals   Darleene Cleaver, PTA 12/03/2022, 9:19 AM

## 2022-12-04 ENCOUNTER — Other Ambulatory Visit: Payer: Self-pay

## 2022-12-04 DIAGNOSIS — R972 Elevated prostate specific antigen [PSA]: Secondary | ICD-10-CM

## 2022-12-06 ENCOUNTER — Ambulatory Visit: Payer: No Typology Code available for payment source | Admitting: Physical Therapy

## 2022-12-06 DIAGNOSIS — M6281 Muscle weakness (generalized): Secondary | ICD-10-CM

## 2022-12-06 DIAGNOSIS — M5459 Other low back pain: Secondary | ICD-10-CM | POA: Diagnosis not present

## 2022-12-06 DIAGNOSIS — R2689 Other abnormalities of gait and mobility: Secondary | ICD-10-CM

## 2022-12-06 DIAGNOSIS — M62830 Muscle spasm of back: Secondary | ICD-10-CM

## 2022-12-06 NOTE — Therapy (Signed)
OUTPATIENT PHYSICAL THERAPY TREATMENT   Patient Name: George Love MRN: [id number] DOB:1955-05-21, 67 y.o., male Today's Date: 12/06/2022  END OF SESSION:  PT End of Session - 12/06/22 0804     Visit Number 6    Date for PT Re-Evaluation 12/27/22    Authorization Type [facility] [employer]    PT Start Time 0804    PT Stop Time 0849    PT Time Calculation (min) 45 min    Activity Tolerance Patient tolerated treatment well    Behavior During Therapy WFL for tasks assessed/performed                  Past Medical History:  Diagnosis Date   Anxiety    GERD (gastroesophageal reflux disease)    Hyperlipidemia    Past Surgical History:  Procedure Laterality Date   ESOPHAGOGASTRODUODENOSCOPY (EGD) WITH PROPOFOL N/A 08/14/2022   Procedure: ESOPHAGOGASTRODUODENOSCOPY (EGD) WITH PROPOFOL;  Surgeon: Vida Rigger, MD;  Location: [facility];  Service: Gastroenterology;  Laterality: N/A;   KNEE ARTHROSCOPY Right    POLYPECTOMY  08/14/2022   Procedure: POLYPECTOMY;  Surgeon: Vida Rigger, MD;  Location: [facility];  Service: Gastroenterology;;   PROSTATE SURGERY     Patient Active Problem List   Diagnosis Date Noted   urinary urgency 11/01/2021   Elevated PSA 10/28/2019    PCP: Marisue Brooklyn   REFERRING PROVIDER: Esperanza Richters, PA-C   REFERRING DIAG: M54.50 (ICD-10-CM) - Midline low back pain without sciatica, unspecified chronicity   THERAPY DIAG:  Other low back pain  Muscle spasm of back  Muscle weakness (generalized)  Other abnormalities of gait and mobility  RATIONALE FOR EVALUATION AND TREATMENT: Rehabilitation  ONSET DATE: ~6 months  NEXT MD VISIT: 11/16/22 & 09/05/23   SUBJECTIVE:                                                                                                                                                                                                         SUBJECTIVE STATEMENT: Pt denies pain today - "doing good".  PAIN: Are  you having pain? No  PERTINENT HISTORY:  anxiety, GERD, HLD, h/o R knee scope  PRECAUTIONS: None  RED FLAGS: None  WEIGHT BEARING RESTRICTIONS: No  FALLS:  Has patient fallen in last 6 months? 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 Lower lumbar facet osteoarthritis. 2 mm of retrolisthesis at L3-4.  PATIENT SURVEYS:  Modified Oswestry 12 / 50 = 24.0 %   SCREENING FOR RED FLAGS: Bowel or bladder incontinence: No Spinal tumors: No Cauda equina syndrome: No Compression fracture: No Abdominal aneurysm: No  COGNITION:  Overall cognitive status: Within functional limits for tasks assessed    SENSATION: WFL  MUSCLE LENGTH: Hamstrings: Mild tight B  ITB: Mild tight B Piriformis: Mild tight R>L Hip flexors: Mild/mod tight B Quads: Mild tight B  POSTURE:  No Significant postural limitations  PALPATION: Increased muscle tension/taut bands along B thoracolumbar paraspinals > QL/psoas but pt denies significant TTP  LUMBAR ROM:   Active  Eval  Flexion WNL  Extension WNL  Right lateral flexion WNL  Left lateral flexion WNL  Right rotation WNL  Left rotation WNL  (Blank rows = not tested)  LOWER EXTREMITY ROM:    Grossly WFL/WNL  LOWER EXTREMITY MMT:    MMT Right eval Left eval  Hip flexion 5 5  Hip extension 4 4+   Hip abduction 5 5  Hip adduction 5 5  Hip internal rotation 5 5  Hip external rotation 4 4  Knee flexion 5 5  Knee extension 5 5  Ankle dorsiflexion 5 5  Ankle plantarflexion    Ankle inversion    Ankle eversion     (Blank rows = not tested)  LUMBAR SPECIAL TESTS:  Straight leg raise test: Negative, Slump test: Negative, and FABER test: Negative   TODAY'S TREATMENT:   12/06/22 THERAPEUTIC EXERCISE: to improve flexibility, strength and mobility.  Demonstration, verbal and tactile cues throughout for technique.  Rec bike L5 x 6 min Seated 3-way lumbar flexion stretch with hands on green Pball x 30" each Seated L QL stretch x 30" Standing QL stretch demonstrated but not attempted  MANUAL THERAPY: To promote normalized muscle tension, improved flexibility, reduced pain, and reduced muscle spasm . Skilled palpation and monitoring of soft tissue during DN Trigger Point Dry-Needling  Treatment instructions: Expect mild to moderate muscle soreness. S/S of pneumothorax if dry needled over a lung field, and to seek immediate medical attention should they occur. Patient verbalized understanding of these instructions and education. Patient Consent Given: Yes Education handout provided: Previously provided Muscles treated: B lumbar multifidi, erector spinae & QL Electrical stimulation performed: Yes Parameters:  20 cps, intensity to produce twitch response - B lumbar multifidi & erector spinae Treatment response/outcome: Twitch Response Elicited and Palpable Increase in Muscle Length STM/DTM, manual TPR and pin & stretch to muscles addressed with DN Ktape - 3 "I" strips - 2 strips along thoracolumbar paraspinals with perpendicular strip at level of greatest pain/muscle tension   12/03/22 THERAPEUTIC EXERCISE: to improve flexibility, strength and mobility.  Demonstration, verbal and tactile cues throughout for technique.  Eliptical L2.0 x Pallof press 10# 2x10 bil Standing shld  ext 20# 2x15  Lat pulls 65# x 15 Good mornings with dowel x 10 Leg press 55# x 20 BLE   11/28/22 THERAPEUTIC EXERCISE: to improve flexibility, strength and mobility.  Demonstration, verbal and tactile cues throughout for  technique.  Bike L5x71min Lat pulls 55# 2x15 - cues to keep shoulders down Standing shld ext 20# 2x10- cues to keep elbows straight Standing pallof press 5# x 15 each direction Seated rows 35# 2x15 HS curls 25# x 15 BLE Knee extension 15# x 15 BLE   PATIENT EDUCATION:  Education details: role of DN, DN rational, procedure, outcomes, potential side effects, and recommended post-treatment exercises/activity, Ktape application, and Ktape wearing and removal instructions Person educated: Patient Education method: Explanation, Demonstration, Handouts, and MedBridgeGO access code updated Education comprehension: verbalized understanding  HOME EXERCISE PROGRAM: *Access Code: [email or web address] URL: [email or web address] Date: 11/26/2022 Prepared by: Glenetta Hew  Exercises - Seated Hamstring Stretch  - 2 x daily - 7 x weekly - 3 reps - 30 sec hold - Supine Hamstring Stretch with Strap  - 2 x daily - 7 x weekly - 3 reps - 30 sec hold - Single Knee to Chest Stretch  - 2 x daily - 7 x weekly - 3 reps - 30 sec hold - Supine Lower Trunk Rotation  - 2 x daily - 7 x weekly - 2 sets - 5 reps - 10-15 sec hold - Child's Pose Stretch  - 2 x daily - 7 x weekly - 3 reps - 30 sec hold - Child's Pose with Sidebending  - 2 x daily - 7 x weekly - 2-3 reps - 30 sec hold - Supine 90/90 Alternating Toe Touch  - 1 x daily - 7 x weekly - 2 sets - 10 reps - Supine Bridge with Resistance Band  - 1 x daily - 7 x weekly - 2 sets - 10 reps  Patient Education - Trigger Point Dry Needling - Posture and Body Mechanics  *Pt using MedBridgeGO app   ASSESSMENT:  CLINICAL IMPRESSION: George Love" reports he feels like the muscle spasms/cramps have been cut in half with transitional  movements. He notes the DN seemed to make a lot of difference, esp within 3-4 days post-DN.  Continued taut bands evident in L>R lumbar paraspinals and QL which were addressed with further MT incorporating DN.  E-stim utilized with DN to lumbar multifidi and erector spinae to promote increased twitch response.  Palpable reduction in muscle tension noted following MT and DN.  Introduced trial of kinesiotaping to further encourage muscle relaxation.  Phil notes the most benefit from child's pose stretches, therefore provided instruction in similar stretch from sitting position using exercise ball as well as QL stretches.  He may benefit from future quadruped lumbar mobility/flexibility and strengthening exercises - will initiate these next visit.  Michele Mcalpine will benefit from continued skilled PT to address above deficits to improve mobility and activity tolerance with decreased pain interference.   OBJECTIVE IMPAIRMENTS: decreased activity tolerance, decreased knowledge of condition, decreased mobility, decreased strength, hypomobility, increased fascial restrictions, impaired perceived functional ability, increased muscle spasms, impaired flexibility, improper body mechanics, postural dysfunction, and pain.   ACTIVITY LIMITATIONS: lifting, bending, sleeping, transfers, and bed mobility  PARTICIPATION LIMITATIONS: community activity and recreational activities and gym workouts  PERSONAL FACTORS: Past/current experiences, Time since onset of injury/illness/exacerbation, and 3+ comorbidities: anxiety, GERD, HLD, h/o R knee scope  are also affecting patient's functional outcome.   REHAB POTENTIAL: Excellent  CLINICAL DECISION MAKING: Stable/uncomplicated  EVALUATION COMPLEXITY: Low   GOALS: Goals reviewed with patient? Yes  SHORT TERM GOALS: Target date: 12/06/2022  Patient will be independent with initial HEP to improve outcomes and carryover.  Baseline: HEP provided on eval Goal status: MET -  11/28/22  2.  Patient will 25% reduction in low back pain upon transitional motions.  Baseline: 8/10 Goal status: MET - 12/03/22 - 50% improvement   LONG TERM GOALS: Target date: 12/27/2022  Patient will be independent with ongoing/advanced HEP for self-management at home.  Baseline:  Goal status: IN PROGRESS  2.  Patient will report 50-75% improvement in low back pain during transition movements supine to sit or rolling over in bed to improve QOL.  Baseline: 8/10 Goal status: IN PROGRESS - 12/03/22 - 50% improvement  3.  Patient to demonstrate ability to achieve and maintain good spinal alignment/posturing and body mechanics needed for daily activities. Baseline:  Goal status: IN PROGRESS  4.  Patient will demonstrate improved B LE strength to 5/5 for improved stability and ease of mobility . Baseline: Refer to above LE MMT table Goal status: IN PROGRESS  5. Patient will report </= 12% on Modified Oswestry to demonstrate improved functional ability with decreased pain interference. Baseline: 12 / 50 = 24.0 % Goal status: IN PROGRESS  6.  Patient will will be able to get up from bed or supine workouts without triggering muscle spasm in ow back for improved mobility and activity tolerance. Baseline:  Goal status: IN PROGRESS  7.  Patient will report no sleep disturbance due to pain when repositioning or turning over in bed. Baseline:  Goal status: IN PROGRESS   PLAN:  PT FREQUENCY: 1-2x/week  PT DURATION: 8 weeks  PLANNED INTERVENTIONS: Therapeutic exercises, Therapeutic activity, Neuromuscular re-education, Balance training, Patient/Family education, Self Care, Joint mobilization, Dry Needling, Electrical stimulation, Spinal manipulation, Spinal mobilization, Cryotherapy, Moist heat, Taping, Traction, Ultrasound, Manual therapy, and Re-evaluation  PLAN FOR NEXT SESSION: Assess response to DN and taping ; continue to review gym equipment if needed; progress lumbopelvic  flexibility, ROM and strengthening - try quadruped mobility and strengthening; review education in proper body mechanics for transitional motions and daily activities PRN; MT +/- DN to address abnormal muscle tension in lumbar paraspinals, QL and psoas muscles; modalities as needed for pain management; continue Ktape for thoracolumbar paraspinals if benefit noted   Marry Guan, PT 12/06/2022, 10:52 AM

## 2022-12-10 ENCOUNTER — Ambulatory Visit: Payer: No Typology Code available for payment source

## 2022-12-10 DIAGNOSIS — M6281 Muscle weakness (generalized): Secondary | ICD-10-CM

## 2022-12-10 DIAGNOSIS — R2689 Other abnormalities of gait and mobility: Secondary | ICD-10-CM

## 2022-12-10 DIAGNOSIS — M5459 Other low back pain: Secondary | ICD-10-CM

## 2022-12-10 DIAGNOSIS — M62830 Muscle spasm of back: Secondary | ICD-10-CM

## 2022-12-10 NOTE — Therapy (Signed)
OUTPATIENT PHYSICAL THERAPY TREATMENT   Patient Name: George Love MRN: [id number] DOB:1956-01-09, 67 y.o., male Today's Date: 12/10/2022  END OF SESSION:  PT End of Session - 12/10/22 0818     Visit Number 7    Date for PT Re-Evaluation 12/27/22    Authorization Type [facility] [employer]    PT Start Time 0803    PT Stop Time 0845    PT Time Calculation (min) 42 min    Activity Tolerance Patient tolerated treatment well    Behavior During Therapy WFL for tasks assessed/performed                   Past Medical History:  Diagnosis Date   Anxiety    GERD (gastroesophageal reflux disease)    Hyperlipidemia    Past Surgical History:  Procedure Laterality Date   ESOPHAGOGASTRODUODENOSCOPY (EGD) WITH PROPOFOL N/A 08/14/2022   Procedure: ESOPHAGOGASTRODUODENOSCOPY (EGD) WITH PROPOFOL;  Surgeon: Vida Rigger, MD;  Location: [facility];  Service: Gastroenterology;  Laterality: N/A;   KNEE ARTHROSCOPY Right    POLYPECTOMY  08/14/2022   Procedure: POLYPECTOMY;  Surgeon: Vida Rigger, MD;  Location: [facility];  Service: Gastroenterology;;   PROSTATE SURGERY     Patient Active Problem List   Diagnosis Date Noted   urinary urgency 11/01/2021   Elevated PSA 10/28/2019    PCP: Marisue Brooklyn   REFERRING PROVIDER: Esperanza Richters, PA-C   REFERRING DIAG: M54.50 (ICD-10-CM) - Midline low back pain without sciatica, unspecified chronicity   THERAPY DIAG:  Other low back pain  Muscle spasm of back  Muscle weakness (generalized)  Other abnormalities of gait and mobility  RATIONALE FOR EVALUATION AND TREATMENT: Rehabilitation  ONSET DATE: ~6 months  NEXT MD VISIT: 11/16/22 & 09/05/23   SUBJECTIVE:                                                                                                                                                                                                         SUBJECTIVE STATEMENT: Today pt reports mild spasms in low back, started  yesterday -- pt does report that he carried about 10-15 cases on water bottles before then. Pt reports that he did not feel any difference with the tape.  PAIN: Are you having pain? No and Yes: NPRS scale: 2/10 Pain location: low back Pain description: spasm  PERTINENT HISTORY:  anxiety, GERD, HLD, h/o R knee scope  PRECAUTIONS: None  RED FLAGS: None  WEIGHT BEARING RESTRICTIONS: No  FALLS:  Has patient fallen in last 6 months? 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 Lower lumbar facet osteoarthritis. 2 mm of retrolisthesis at L3-4.  PATIENT SURVEYS:  Modified Oswestry 12 / 50 = 24.0 %   SCREENING FOR RED FLAGS: Bowel or bladder incontinence: No Spinal tumors: No Cauda equina syndrome: No Compression fracture: No Abdominal aneurysm: No  COGNITION:  Overall cognitive status: Within functional limits for tasks assessed    SENSATION: WFL  MUSCLE LENGTH: Hamstrings: Mild tight B  ITB: Mild tight B Piriformis: Mild tight R>L Hip flexors: Mild/mod tight B Quads: Mild tight B  POSTURE:  No Significant postural limitations  PALPATION: Increased muscle tension/taut bands along B thoracolumbar paraspinals > QL/psoas but pt denies significant TTP  LUMBAR ROM:   Active  Eval  Flexion WNL  Extension WNL  Right lateral  flexion WNL  Left lateral flexion WNL  Right rotation WNL  Left rotation WNL  (Blank rows = not tested)  LOWER EXTREMITY ROM:    Grossly WFL/WNL  LOWER EXTREMITY MMT:    MMT Right eval Left eval  Hip flexion 5 5  Hip extension 4 4+  Hip abduction 5 5  Hip adduction 5 5  Hip internal rotation 5 5  Hip external rotation 4 4  Knee flexion 5 5  Knee extension 5 5  Ankle dorsiflexion 5 5  Ankle plantarflexion    Ankle inversion    Ankle eversion     (Blank rows = not tested)  LUMBAR SPECIAL TESTS:  Straight leg raise test: Negative, Slump test: Negative, and FABER test: Negative   TODAY'S TREATMENT:  12/10/22 THERAPEUTIC EXERCISE: to improve flexibility, strength and mobility.  Demonstration, verbal and tactile cues throughout for technique.  Eliptical L2.0 x Seated 3-way lumbar flexion stretch with hands on green Pball 2 x 30" each Standing QL doorway stretch x 30 sec B - cues for proper foot placement Standing side bend 10# x 10 B LTR feet on green pball x 10 B - cues to keep back flat HS curls feet on green pball x 10   12/06/22 THERAPEUTIC EXERCISE: to improve flexibility, strength and mobility.  Demonstration, verbal and tactile cues throughout for technique.  Rec bike L5 x 6 min Seated 3-way lumbar flexion stretch with hands on green Pball x 30" each Seated L QL stretch x 30" Standing QL stretch demonstrated but not attempted  MANUAL THERAPY: To promote normalized muscle tension, improved flexibility, reduced pain, and reduced muscle spasm . Skilled palpation and monitoring of soft tissue during DN Trigger Point Dry-Needling  Treatment instructions: Expect mild to moderate muscle soreness. S/S of pneumothorax if dry needled over a lung field, and to seek immediate medical attention should they occur. Patient verbalized understanding of these instructions and education. Patient Consent Given: Yes Education handout provided: Previously provided Muscles  treated: B lumbar multifidi, erector spinae & QL Electrical stimulation performed: Yes Parameters:  20 cps, intensity to produce  twitch response - B lumbar multifidi & erector spinae Treatment response/outcome: Twitch Response Elicited and Palpable Increase in Muscle Length STM/DTM, manual TPR and pin & stretch to muscles addressed with DN Ktape - 3 "I" strips - 2 strips along thoracolumbar paraspinals with perpendicular strip at level of greatest pain/muscle tension   12/03/22 THERAPEUTIC EXERCISE: to improve flexibility, strength and mobility.  Demonstration, verbal and tactile cues throughout for technique.  Eliptical L2.0 x Pallof press 10# 2x10 bil Standing shld ext 20# 2x15  Lat pulls 65# x 15 Good mornings with dowel x 10 Leg press 55# x 20 BLE   11/28/22 THERAPEUTIC EXERCISE: to improve flexibility, strength and mobility.  Demonstration, verbal and tactile cues throughout for technique.  Bike L5x61min Lat pulls 55# 2x15 - cues to keep shoulders down Standing shld ext 20# 2x10- cues to keep elbows straight Standing pallof press 5# x 15 each direction Seated rows 35# 2x15 HS curls 25# x 15 BLE Knee extension 15# x 15 BLE   PATIENT EDUCATION:  Education details: HEP update - QL stretch Person educated: Patient Education method: Explanation, Demonstration, and Handouts Education comprehension: verbalized understanding, returned demonstration, and verbal cues required  HOME EXERCISE PROGRAM: *Access Code: [email or web address] URL: [email or web address] Date: 12/10/2022 Prepared by: Verta Ellen  Exercises - Seated Hamstring Stretch  - 2 x daily - 7 x weekly - 3 reps - 30 sec hold - Supine Hamstring Stretch with Strap  - 2 x daily - 7 x weekly - 3 reps - 30 sec hold - Single Knee to Chest Stretch  - 2 x daily - 7 x weekly - 3 reps - 30 sec hold - Supine Lower Trunk Rotation  - 2 x daily - 7 x weekly - 2 sets - 5 reps - 10-15 sec hold - Child's Pose Stretch  - 2 x  daily - 7 x weekly - 3 reps - 30 sec hold - Child's Pose with Sidebending  - 2 x daily - 7 x weekly - 2-3 reps - 30 sec hold - Supine 90/90 Alternating Toe Touch  - 1 x daily - 7 x weekly - 2 sets - 10 reps - Supine Bridge with Resistance Band  - 1 x daily - 7 x weekly - 2 sets - 10 reps - Standing Quadratus Lumborum Stretch with Doorway  - 2 x daily - 7 x weekly - 3 sets - 3 reps - 15-30 sec hold  Patient Education - Trigger Point Dry Needling - Posture and Body Mechanics  *Pt using MedBridgeGO app   ASSESSMENT:  CLINICAL IMPRESSION: Skilled intervention focused on lumbar mobility and flexibility, to address recent c/o muscle spasm. He was able to complete all interventions. Corrected form mostly with the standing QL door stretch and trunk rotations. Improved pain after interventions. Michele Mcalpine will benefit from continued skilled PT to address above deficits to improve mobility and activity tolerance with decreased pain interference.   OBJECTIVE IMPAIRMENTS: decreased activity tolerance, decreased knowledge of condition, decreased mobility, decreased strength, hypomobility, increased fascial restrictions, impaired perceived functional ability, increased muscle spasms, impaired flexibility, improper body mechanics, postural dysfunction, and pain.   ACTIVITY LIMITATIONS: lifting, bending, sleeping, transfers, and bed mobility  PARTICIPATION LIMITATIONS: community activity and recreational activities and gym workouts  PERSONAL FACTORS: Past/current experiences, Time since onset of injury/illness/exacerbation, and 3+ comorbidities: anxiety, GERD, HLD, h/o R knee scope  are also affecting patient's functional outcome.   REHAB POTENTIAL: Excellent  CLINICAL DECISION MAKING: Stable/uncomplicated  EVALUATION COMPLEXITY: Low  GOALS: Goals reviewed with patient? Yes  SHORT TERM GOALS: Target date: 12/06/2022  Patient will be independent with initial HEP to improve outcomes and carryover.   Baseline: HEP provided on eval Goal status: MET - 11/28/22  2.  Patient will 25% reduction in low back pain upon transitional motions.  Baseline: 8/10 Goal status: MET - 12/03/22 - 50% improvement   LONG TERM GOALS: Target date: 12/27/2022  Patient will be independent with ongoing/advanced HEP for self-management at home.  Baseline:  Goal status: IN PROGRESS  2.  Patient will report 50-75% improvement in low back pain during transition movements supine to sit or rolling over in bed to improve QOL.  Baseline: 8/10 Goal status: IN PROGRESS - 12/03/22 - 50% improvement  3.  Patient to demonstrate ability to achieve and maintain good spinal alignment/posturing and body mechanics needed for daily activities. Baseline:  Goal status: IN PROGRESS  4.  Patient will demonstrate improved B LE strength to 5/5 for improved stability and ease of mobility . Baseline: Refer to above LE MMT table Goal status: IN PROGRESS  5. Patient will report </= 12% on Modified Oswestry to demonstrate improved functional ability with decreased pain interference. Baseline: 12 / 50 = 24.0 % Goal status: IN PROGRESS  6.  Patient will will be able to get up from bed or supine workouts without triggering muscle spasm in ow back for improved mobility and activity tolerance. Baseline:  Goal status: IN PROGRESS  7.  Patient will report no sleep disturbance due to pain when repositioning or turning over in bed. Baseline:  Goal status: IN PROGRESS   PLAN:  PT FREQUENCY: 1-2x/week  PT DURATION: 8 weeks  PLANNED INTERVENTIONS: Therapeutic exercises, Therapeutic activity, Neuromuscular re-education, Balance training, Patient/Family education, Self Care, Joint mobilization, Dry Needling, Electrical stimulation, Spinal manipulation, Spinal mobilization, Cryotherapy, Moist heat, Taping, Traction, Ultrasound, Manual therapy, and Re-evaluation  PLAN FOR NEXT SESSION: progress lumbopelvic flexibility, ROM and  strengthening - try quadruped mobility and strengthening; review education in proper body mechanics for transitional motions and daily activities PRN; MT +/- DN to address abnormal muscle tension in lumbar paraspinals, QL and psoas muscles; modalities as needed for pain management; continue Ktape for thoracolumbar paraspinals if benefit noted   Darleene Cleaver, PTA 12/10/2022, 8:52 AM

## 2022-12-13 ENCOUNTER — Encounter: Payer: No Typology Code available for payment source | Admitting: Physical Therapy

## 2022-12-17 ENCOUNTER — Ambulatory Visit: Payer: No Typology Code available for payment source

## 2022-12-20 ENCOUNTER — Ambulatory Visit: Payer: No Typology Code available for payment source | Admitting: Physical Therapy

## 2023-01-03 ENCOUNTER — Other Ambulatory Visit: Payer: Self-pay | Admitting: Gastroenterology

## 2023-01-08 ENCOUNTER — Other Ambulatory Visit (HOSPITAL_BASED_OUTPATIENT_CLINIC_OR_DEPARTMENT_OTHER): Payer: Self-pay

## 2023-01-10 ENCOUNTER — Telehealth: Payer: Self-pay | Admitting: Medical

## 2023-01-10 MED ORDER — ESCITALOPRAM OXALATE 10 MG PO TABS: 10.0000 mg | ORAL_TABLET | Freq: Every day | ORAL | 3 refills | Status: DC

## 2023-01-10 NOTE — Telephone Encounter (Signed)
Pharmacy updated and refill sent

## 2023-01-10 NOTE — Telephone Encounter (Signed)
Pt's spouse Junius Roads) came in office stating spouse has informed by Earleen Reaper that he wanted to change his pharmacy to [facility] on [address] [place], [place] [address] Tel [phone]. Pt is needing to have the pharmacy update so that he can get in the future his next refill when he request it. Please update pharmacy ASAP.

## 2023-02-08 ENCOUNTER — Ambulatory Visit: Payer: 59 | Admitting: Urology

## 2023-02-21 ENCOUNTER — Other Ambulatory Visit (HOSPITAL_BASED_OUTPATIENT_CLINIC_OR_DEPARTMENT_OTHER): Payer: Self-pay

## 2023-03-18 ENCOUNTER — Encounter: Payer: Self-pay | Admitting: Urology

## 2023-03-20 ENCOUNTER — Encounter: Payer: Self-pay | Admitting: Sports Medicine

## 2023-03-24 ENCOUNTER — Encounter: Payer: Self-pay | Admitting: Urology

## 2023-04-03 ENCOUNTER — Encounter (HOSPITAL_COMMUNITY): Payer: Self-pay | Admitting: Gastroenterology

## 2023-04-07 NOTE — Anesthesia Preprocedure Evaluation (Signed)
 Anesthesia Evaluation  Patient identified by MRN, date of birth, ID band Patient awake    Reviewed: Allergy & Precautions, NPO status , Patient's Chart, lab work & pertinent test results  History of Anesthesia Complications Negative for: history of anesthetic complications  Airway Mallampati: I  TM Distance: >3 FB Neck ROM: Full    Dental  (+) Dental Advisory Given, Caps   Pulmonary former smoker   breath sounds clear to auscultation       Cardiovascular (-) angina negative cardio ROS  Rhythm:Regular Rate:Normal     Neuro/Psych  PSYCHIATRIC DISORDERS Anxiety     negative neurological ROS     GI/Hepatic Neg liver ROS,GERD  Controlled and Medicated,,duodenal polyp   Endo/Other  Obesity BMI 33.2  Renal/GU negative Renal ROS     Musculoskeletal negative musculoskeletal ROS (+)    Abdominal   Peds  Hematology negative hematology ROS (+)   Anesthesia Other Findings   Reproductive/Obstetrics                             Anesthesia Physical Anesthesia Plan  ASA: 2  Anesthesia Plan: MAC   Post-op Pain Management: Minimal or no pain anticipated   Induction: Intravenous  PONV Risk Score and Plan: 1 and TIVA and Treatment may vary due to age or medical condition  Airway Management Planned: Natural Airway and Simple Face Mask  Additional Equipment:   Intra-op Plan:   Post-operative Plan:   Informed Consent: I have reviewed the patients History and Physical, chart, labs and discussed the procedure including the risks, benefits and alternatives for the proposed anesthesia with the patient or authorized representative who has indicated his/her understanding and acceptance.     Dental advisory given  Plan Discussed with: CRNA and Surgeon  Anesthesia Plan Comments:        Anesthesia Quick Evaluation

## 2023-04-09 ENCOUNTER — Other Ambulatory Visit: Payer: Self-pay

## 2023-04-09 ENCOUNTER — Encounter (HOSPITAL_COMMUNITY)
Admission: RE | Disposition: A | Discharge status: Home / Self Care | Payer: Self-pay | Source: Home / Self Care | Attending: Gastroenterology

## 2023-04-09 ENCOUNTER — Ambulatory Visit (HOSPITAL_BASED_OUTPATIENT_CLINIC_OR_DEPARTMENT_OTHER): Payer: Medicare Other | Admitting: Anesthesiology

## 2023-04-09 ENCOUNTER — Encounter (HOSPITAL_COMMUNITY): Payer: Self-pay | Admitting: Gastroenterology

## 2023-04-09 ENCOUNTER — Ambulatory Visit (HOSPITAL_COMMUNITY)
Admission: RE | Admit: 2023-04-09 | Discharge: 2023-04-09 | Disposition: A | Discharge status: Home / Self Care | Payer: Medicare Other | Attending: Gastroenterology | Admitting: Gastroenterology

## 2023-04-09 ENCOUNTER — Ambulatory Visit (HOSPITAL_COMMUNITY): Payer: Self-pay | Admitting: Anesthesiology

## 2023-04-09 DIAGNOSIS — Z6833 Body mass index (BMI) 33.0-33.9, adult: Secondary | ICD-10-CM | POA: Diagnosis not present

## 2023-04-09 DIAGNOSIS — F419 Anxiety disorder, unspecified: Secondary | ICD-10-CM | POA: Diagnosis not present

## 2023-04-09 DIAGNOSIS — Z79899 Other long term (current) drug therapy: Secondary | ICD-10-CM | POA: Diagnosis not present

## 2023-04-09 DIAGNOSIS — K219 Gastro-esophageal reflux disease without esophagitis: Secondary | ICD-10-CM | POA: Diagnosis not present

## 2023-04-09 DIAGNOSIS — K317 Polyp of stomach and duodenum: Secondary | ICD-10-CM | POA: Insufficient documentation

## 2023-04-09 DIAGNOSIS — E669 Obesity, unspecified: Secondary | ICD-10-CM | POA: Diagnosis not present

## 2023-04-09 DIAGNOSIS — Z87891 Personal history of nicotine dependence: Secondary | ICD-10-CM | POA: Insufficient documentation

## 2023-04-09 DIAGNOSIS — K449 Diaphragmatic hernia without obstruction or gangrene: Secondary | ICD-10-CM

## 2023-04-09 HISTORY — PX: POLYPECTOMY: SHX5525

## 2023-04-09 HISTORY — PX: ESOPHAGOGASTRODUODENOSCOPY (EGD) WITH PROPOFOL: SHX5813

## 2023-04-09 SURGERY — ESOPHAGOGASTRODUODENOSCOPY (EGD) WITH PROPOFOL
Anesthesia: Monitor Anesthesia Care

## 2023-04-09 MED ORDER — LIDOCAINE HCL (CARDIAC) PF 100 MG/5ML IV SOSY
PREFILLED_SYRINGE | INTRAVENOUS | Status: DC | PRN
  Administered 2023-04-09: 50 mg via INTRAVENOUS

## 2023-04-09 MED ORDER — SODIUM CHLORIDE 0.9 % IV SOLN: INTRAVENOUS | Status: DC | PRN

## 2023-04-09 MED ORDER — PROPOFOL 10 MG/ML IV BOLUS
INTRAVENOUS | Status: DC | PRN
  Administered 2023-04-09: 300 mg via INTRAVENOUS

## 2023-04-09 MED ORDER — DOXYCYCLINE HYCLATE 100 MG PO CAPS: 100.0000 mg | ORAL_CAPSULE | Freq: Two times a day (BID) | ORAL | 0 refills | Status: DC

## 2023-04-09 MED ORDER — PROPOFOL 500 MG/50ML IV EMUL
INTRAVENOUS | Status: AC
Start: 2023-04-09 — End: ?
  Filled 2023-04-09: qty 50

## 2023-04-09 SURGICAL SUPPLY — 14 items

## 2023-04-09 NOTE — Addendum Note (Signed)
 Addendum  created 04/09/23 1000 by Jairo Ben, MD   Clinical Note Signed

## 2023-04-09 NOTE — Op Note (Signed)
 [facility] Patient Name: George Love Procedure Date: 04/09/2023 MRN: [id number] Attending MD: Vida Rigger , MD, [id number] Date of Birth: 08-12-1955 CSN: [id number] Age: 68 Admit Type: Ambulatory Procedure:                Upper GI endoscopy Indications:              Follow-up of polyps in the duodenum Providers:                Vida Rigger, MD, Rogue Jury, RN, Marja Kays, Technician Referring MD:              Medicines:                Monitored Anesthesia Care Complications:            No immediate complications. Estimated Blood Loss:     Estimated blood loss: none. Procedure:                Pre-Anesthesia Assessment:                           - Prior to the procedure, a History and Physical                            was performed, and patient medications and                            allergies were reviewed. The patient's tolerance of                            previous anesthesia was also reviewed. The risks                            and benefits of the procedure and the sedation                            options and risks were discussed with the patient.                            All questions were answered, and informed consent                            was obtained. Prior Anticoagulants: The patient has                            taken no anticoagulant or antiplatelet agents. ASA                            Grade Assessment: II - A patient with mild systemic                            disease. After reviewing the risks and benefits,  the patient was deemed in satisfactory condition to                            undergo the procedure.                           After obtaining informed consent, the endoscope was                            passed under direct vision. Throughout the                            procedure, the patient's blood pressure, pulse, and                            oxygen  saturations were monitored continuously. The                            [id number]-[id number] ([id number]) Olympus endoscope was introduced                            through the mouth, and advanced to the third part                            of duodenum. The upper GI endoscopy was                            accomplished without difficulty. The patient                            tolerated the procedure well. Scope In: Scope Out: Findings:      The larynx was normal.      A small hiatal hernia was present.      Multiple small and few medium semi-sessile and semipedunculated polyps       with no bleeding and no stigmata of recent bleeding were found in the       gastric body. Some biopsies were taken with a cold forceps for histology.      The duodenal bulb, first portion of the duodenum, second portion of the       duodenum and third portion of the duodenum were normal. No residual       duodenal polyp was seen      The exam was otherwise without abnormality. Impression:               - Normal larynx.                           - Small hiatal hernia.                           - Multiple gastric polyps. Biopsied.                           - Normal duodenal bulb, first portion of the  duodenum, second portion of the duodenum and third                            portion of the duodenum. No residual duodenal polyp                            was seen                           - The examination was otherwise normal. Moderate Sedation:      Not Applicable - Patient had care per Anesthesia. Recommendation:           - Patient has a contact number available for                            emergencies. The signs and symptoms of potential                            delayed complications were discussed with the                            patient. Return to normal activities tomorrow.                            Written discharge instructions were provided to the                             patient.                           - Soft diet today.                           - Continue present medications.                           - Await pathology results.                           - Return to GI clinic PRN.                           - Telephone GI clinic if symptomatic PRN.                           - Telephone GI clinic for pathology results in 1                            week.                           - Repeat upper endoscopy in 3 years for                            surveillance. Procedure Code(s):        --- Professional ---  57846, Esophagogastroduodenoscopy, flexible,                            transoral; with biopsy, single or multiple Diagnosis Code(s):        --- Professional ---                           K44.9, Diaphragmatic hernia without obstruction or                            gangrene                           K31.7, Polyp of stomach and duodenum CPT copyright 2022 American Medical Association. All rights reserved. The codes documented in this report are preliminary and upon coder review may  be revised to meet current compliance requirements. Vida Rigger, MD 04/09/2023 9:59:40 AM This report has been signed electronically. Number of Addenda: 0

## 2023-04-09 NOTE — Discharge Instructions (Addendum)
 Call if question or problem otherwise call for biopsy report in 1 week follow-up as needed and probable repeat endoscopy in 3 years just to be sureYOU HAD AN ENDOSCOPIC PROCEDURE TODAY: Refer to the procedure report and other information in the discharge instructions given to you for any specific questions about what was found during the examination. If this information does not answer your questions, please call [facility] office at [phone] to clarify.   YOU SHOULD EXPECT: Some feelings of bloating in the abdomen. Passage of more gas than usual. Walking can help get rid of the air that was put into your GI tract during the procedure and reduce the bloating. If you had a lower endoscopy (such as a colonoscopy or flexible sigmoidoscopy) you may notice spotting of blood in your stool or on the toilet paper. Some abdominal soreness may be present for a day or two, also.  DIET: Your first meal following the procedure should be a light meal and then it is ok to progress to your normal diet. A half-sandwich or bowl of soup is an example of a good first meal. Heavy or fried foods are harder to digest and may make you feel nauseous or bloated. Drink plenty of fluids but you should avoid alcoholic beverages for 24 hours. If you had a esophageal dilation, please see attached instructions for diet.    ACTIVITY: Your care partner should take you home directly after the procedure. You should plan to take it easy, moving slowly for the rest of the day. You can resume normal activity the day after the procedure however YOU SHOULD NOT DRIVE, use power tools, machinery or perform tasks that involve climbing or major physical exertion for 24 hours (because of the sedation medicines used during the test).   SYMPTOMS TO REPORT IMMEDIATELY: A gastroenterologist can be reached at any hour. Please call [phone]  for any of the following symptoms:  Following lower endoscopy (colonoscopy, flexible sigmoidoscopy) Excessive  amounts of blood in the stool  Significant tenderness, worsening of abdominal pains  Swelling of the abdomen that is new, acute  Fever of 100 or higher  Following upper endoscopy (EGD, EUS, ERCP, esophageal dilation) Vomiting of blood or coffee ground material  New, significant abdominal pain  New, significant chest pain or pain under the shoulder blades  Painful or persistently difficult swallowing  New shortness of breath  Black, tarry-looking or red, bloody stools  FOLLOW UP:  If any biopsies were taken you will be contacted by phone or by letter within the next 1-3 weeks. Call [phone]  if you have not heard about the biopsies in 3 weeks.  Please also call with any specific questions about appointments or follow up tests.

## 2023-04-09 NOTE — Progress Notes (Signed)
 George Love 9:22 AM  Subjective: Patient doing well without any problems since she was seen recently in the office we rediscussed the procedure  Objective: Vital signs stable afebrile no acute distress exam please see preassessment evaluation  Assessment: Duodenal adenoma  Plan: Okay to proceed with endoscopy probable removal with anesthesia assistance [facility]  office [phone] After 5PM or if no answer call [phone]

## 2023-04-09 NOTE — Anesthesia Postprocedure Evaluation (Addendum)
 Anesthesia Post Note  Patient: George Love  Procedure(s) Performed: ESOPHAGOGASTRODUODENOSCOPY (EGD) WITH PROPOFOL POLYPECTOMY     Patient location during evaluation: Endoscopy Anesthesia Type: MAC Level of consciousness: awake and alert, patient cooperative and oriented Pain management: pain level controlled Vital Signs Assessment: post-procedure vital signs reviewed and stable Respiratory status: nonlabored ventilation, spontaneous breathing and respiratory function stable Cardiovascular status: blood pressure returned to baseline and stable Postop Assessment: no apparent nausea or vomiting Anesthetic complications: no   No notable events documented.  Last Vitals:  Vitals:   04/09/23 0950 04/09/23 0955  BP: 138/77   Pulse: (!) 55 (!) 56  Resp: 10 13  Temp:    SpO2: 100% 100%    Last Pain:  Vitals:   04/09/23 0944  TempSrc: Temporal  PainSc: Asleep                 Vada Swift,E. Jaycen Vercher

## 2023-04-09 NOTE — Transfer of Care (Signed)
 Immediate Anesthesia Transfer of Care Note  Patient: George Love  Procedure(s) Performed: ESOPHAGOGASTRODUODENOSCOPY (EGD) WITH PROPOFOL POLYPECTOMY  Patient Location: PACU and Endoscopy Unit  Anesthesia Type:MAC  Level of Consciousness: drowsy  Airway & Oxygen Therapy: Patient Spontanous Breathing and Patient connected to face mask oxygen  Post-op Assessment: Report given to RN and Post -op Vital signs reviewed and stable  Post vital signs: Reviewed and stable  Last Vitals:  Vitals Value Taken Time  BP 146/73   Temp    Pulse 57 04/09/23 0944  Resp 8 04/09/23 0944  SpO2 98 % 04/09/23 0944    Last Pain:  Vitals:   04/09/23 0802  TempSrc: Tympanic  PainSc: 0-No pain         Complications: No notable events documented.

## 2023-04-10 ENCOUNTER — Encounter: Payer: Self-pay | Admitting: Urology

## 2023-04-10 LAB — SURGICAL PATHOLOGY

## 2023-04-10 NOTE — Telephone Encounter (Signed)
 Sorry sent to wrong [facility]. Please see below Carney Bern. Thanks!

## 2023-04-10 NOTE — Telephone Encounter (Signed)
 Can you let me know how patient can view results scanned in media? Thanks

## 2023-04-12 ENCOUNTER — Encounter (HOSPITAL_COMMUNITY): Payer: Self-pay | Admitting: Gastroenterology

## 2023-04-24 ENCOUNTER — Ambulatory Visit: Payer: Medicare Other | Admitting: Urology

## 2023-04-24 ENCOUNTER — Encounter: Payer: Self-pay | Admitting: Urology

## 2023-04-24 VITALS — BP 156/84 | HR 75

## 2023-04-24 DIAGNOSIS — R3915 Urgency of urination: Secondary | ICD-10-CM

## 2023-04-24 DIAGNOSIS — R351 Nocturia: Secondary | ICD-10-CM | POA: Diagnosis not present

## 2023-04-24 DIAGNOSIS — R972 Elevated prostate specific antigen [PSA]: Secondary | ICD-10-CM | POA: Diagnosis not present

## 2023-04-24 DIAGNOSIS — N210 Calculus in bladder: Secondary | ICD-10-CM

## 2023-04-24 NOTE — Patient Instructions (Signed)
Bladder Stone  A bladder stone is a buildup of crystals made from the proteins and minerals found in urine. These substances build up when urine becomes too concentrated. Urine is concentrated when there is less water and more proteins and minerals in it. Bladder stones usually develop when a person has another medical condition that prevents the bladder from emptying completely. Crystals can form in the small amount of urine that is left in the bladder. Bladder stones that grow large can become painful and may block the flow of urine. What are the causes? This condition may be caused by: An enlarged prostate, which prevents the bladder from emptying well. An infection of a part of your urinary system (urinary tract infection, or UTI). This includes the: Kidneys. Bladder. Ureters. These are the tubes that carry urine to your bladder. Urethra. This is the tube that drains urine from your bladder. A weak spot in the bladder that creates a small pouch (bladder diverticulum). Nerve damage that may interfere with the signals from your brain to your bladder muscles (neurogenic bladder). This can result from conditions such as Parkinson's disease or spinal cord injuries. What increases the risk? This condition is more likely to develop in people who: Get frequent UTIs. Have another medical condition that affects the bladder. Have a history of bladder surgery. Have a spinal cord injury. Have an abnormal shape of the bladder (deformity). What are the signs or symptoms? Common symptoms of this condition include: Pain in the abdomen. A need to urinate more often. Difficulty or pain when urinating. Blood in the urine. Cloudy urine or urine that is dark in color. Pain in the penis or testicles in men. Small bladder stones do not always cause symptoms. How is this diagnosed? This condition may be diagnosed based on your symptoms, medical history, and physical exam. The physical exam will check for  tenderness in your abdomen. For men, an exam in the rectum may be done to check the prostate gland. You may have tests, such as: A urine test (urinalysis). A urine sample test to check for other infections (culture). Blood tests, including tests to look for a certain substance (creatinine). A creatinine level that is higher than normal could indicate a blockage. A procedure to check your bladder using a scope with a camera (cystoscopy). You may also have imaging studies, such as: CT scan or ultrasound of your abdomen and the area between your hip bones (pelvis or pelvic area). An X-ray of your urinary system. How is this treated? This condition may be treated with: Cystolitholapaxy. This procedure uses a laser, ultrasound, or other device to break the stone into smaller pieces. Fluids are used to flush the small pieces from the area. Surgery to remove the stone. A stent. This is a small mesh tube that is threaded into your ureter to make urine flow. Medicines to treat pain. Follow these instructions at home: Medicines Take over-the-counter and prescription medicines only as told by your health care provider. Ask your health care provider if the medicine prescribed to you: Requires you to avoid driving or using heavy machinery. Can cause constipation. You may need to take these actions to prevent or treat constipation: Take over-the-counter or prescription medicines. Eat foods that are high in fiber, such as beans, whole grains, and fresh fruits and vegetables. Limit foods that are high in fat and processed sugars, such as fried or sweet foods. Alcohol use Do not drink alcohol if: Your health care provider tells you not to  drink. You are pregnant, may be pregnant, or are planning to become pregnant. If you drink alcohol: Limit how much you drink to: 0-1 drink a day for women. 0-2 drinks a day for men. Be aware of how much alcohol is in your drink. In the [place] one drink equals one 12  oz bottle of beer (355 mL), one 5 oz glass of wine (148 mL), or one 1 oz glass of hard liquor (44 mL). Activity Rest as told by your health care provider. Return to your normal activities as told by your health care provider. Ask your health care provider what activities are safe for you. General instructions  Drink enough fluid to keep your urine pale yellow. Tell your health care provider about any unusual symptoms related to urinating. Early diagnosis of an enlarged prostate and other bladder conditions may reduce your risk of getting bladder stones. Do not use any products that contain nicotine or tobacco, such as cigarettes, e-cigarettes, or chewing tobacco. If you need help quitting, ask your health care provider. Do not use drugs. Where to find more information [facility]): www.[facility].org Contact a health care provider if you: Have a fever. Feel nauseous or vomit. Are unable to urinate. Have a large amount of blood in your urine. Get help right away if you: Have severe back pain or pain in the lower part of your abdomen. Cannot eat or drink without vomiting. Vomit after taking your medicine. Summary A bladder stone is a buildup of crystals made from the proteins and minerals found in urine. These substances build up when urine becomes too concentrated. Bladder stones that grow large can become painful and may block the flow of urine. Bladder stones may be treated with a laser, a stent, surgery, or pain medicines. This information is not intended to replace advice given to you by your health care provider. Make sure you discuss any questions you have with your health care provider. Document Revised: 01/08/2022 Document Reviewed: 01/08/2022 Elsevier Patient Education  2024 [employer].

## 2023-04-24 NOTE — Progress Notes (Signed)
 04/24/2023 4:01 PM   George Love April 06, 1955 [id number]  Referring provider: Esperanza Richters, PA-C [address] [place],  [place] [address]  Elevated PSA   HPI: [name] is a 20JY here for followup for elevated PSA and BPH with nocturia.  He negative MRI fusion biopsy. He was found to have a 2.4cm bladder calculus on imaging. He is currently on doxycycline 100mg  BID for 28 days.    PMH: Past Medical History:  Diagnosis Date   Anxiety    GERD (gastroesophageal reflux disease)    Hyperlipidemia     Surgical History: Past Surgical History:  Procedure Laterality Date   ESOPHAGOGASTRODUODENOSCOPY (EGD) WITH PROPOFOL N/A 08/14/2022   Procedure: ESOPHAGOGASTRODUODENOSCOPY (EGD) WITH PROPOFOL;  Surgeon: Vida Rigger, MD;  Location: [facility];  Service: Gastroenterology;  Laterality: N/A;   ESOPHAGOGASTRODUODENOSCOPY (EGD) WITH PROPOFOL N/A 04/09/2023   Procedure: ESOPHAGOGASTRODUODENOSCOPY (EGD) WITH PROPOFOL;  Surgeon: Vida Rigger, MD;  Location: [facility];  Service: Gastroenterology;  Laterality: N/A;   KNEE ARTHROSCOPY Right    POLYPECTOMY  08/14/2022   Procedure: POLYPECTOMY;  Surgeon: Vida Rigger, MD;  Location: [facility];  Service: Gastroenterology;;   POLYPECTOMY  04/09/2023   Procedure: POLYPECTOMY;  Surgeon: Vida Rigger, MD;  Location: [facility];  Service: Gastroenterology;;   PROSTATE SURGERY      Home Medications:  Allergies as of 04/24/2023   No Known Allergies      Medication List        Accurate as of April 24, 2023  4:01 PM. If you have any questions, ask your nurse or doctor.          cholecalciferol 25 MCG (1000 UNIT) tablet Commonly known as: VITAMIN D3 Take 1,000 Units by mouth daily.   doxycycline 100 MG capsule Commonly known as: VIBRAMYCIN Take 1 capsule (100 mg total) by mouth every 12 (twelve) hours.   escitalopram 10 MG tablet Commonly known as: Lexapro Take 1 tablet (10 mg total) by mouth daily.   Gemtesa 75 MG  Tabs Generic drug: Vibegron Take 1 tablet (75 mg total) by mouth daily.   multivitamin tablet Take 1 tablet by mouth daily.   Red Yeast Rice 600 MG Caps Take 1,200 mg by mouth daily.   vitamin C 1000 MG tablet Take 1,000 mg by mouth daily.   Vitamin K2 100 MCG Tabs Take 100 mcg by mouth daily.   zinc sulfate (50mg  elemental zinc) 220 (50 Zn) MG capsule Take 220 mg by mouth daily.        Allergies: No Known Allergies  Family History: No family history on file.  Social History:  reports that he quit smoking about 9 years ago. His smoking use included cigarettes. He started smoking about 29 years ago. He has a 20 pack-year smoking history. He has never used smokeless tobacco. He reports current alcohol use. He reports that he does not use drugs.  ROS: All other review of systems were reviewed and are negative except what is noted above in HPI  Physical Exam: BP (!) 156/84   Pulse 75   Constitutional:  Alert and oriented, No acute distress. HEENT: [place] AT, moist mucus membranes.  Trachea midline, no masses. Cardiovascular: No clubbing, cyanosis, or edema. Respiratory: Normal respiratory effort, no increased work of breathing. GI: Abdomen is soft, nontender, nondistended, no abdominal masses GU: No CVA tenderness.  Lymph: No cervical or inguinal lymphadenopathy. Skin: No rashes, bruises or suspicious lesions. Neurologic: Grossly intact, no focal deficits, moving all 4  extremities. Psychiatric: Normal mood and affect.  Laboratory Data: Lab Results  Component Value Date   WBC 5.2 09/04/2022   HGB 12.8 (L) 09/04/2022   HCT 38.9 (L) 09/04/2022   MCV 90.4 09/04/2022   PLT 220.0 09/04/2022    Lab Results  Component Value Date   CREATININE 0.77 09/04/2022    Lab Results  Component Value Date   PSA 6.18 (H) 08/23/2021    No results found for: "TESTOSTERONE"  No results found for: "HGBA1C"  Urinalysis    Component Value Date/Time   APPEARANCEUR Clear  11/02/2022 1006   GLUCOSEU Negative 11/02/2022 1006   BILIRUBINUR Negative 11/02/2022 1006   PROTEINUR Negative 11/02/2022 1006   NITRITE Negative 11/02/2022 1006   LEUKOCYTESUR Negative 11/02/2022 1006    Lab Results  Component Value Date   LABMICR Comment 11/02/2022   WBCUA 6-10 (A) 11/01/2021   LABEPIT 0-10 11/01/2021   BACTERIA Few (A) 11/01/2021    Pertinent Imaging:  No results found for this or any previous visit.  No results found for this or any previous visit.  No results found for this or any previous visit.  No results found for this or any previous visit.  No results found for this or any previous visit.  No results found for this or any previous visit.  No results found for this or any previous visit.  No results found for this or any previous visit.   Assessment & Plan:    1. Elevated PSA (Primary) -followup 3-6 months with PSA - Urinalysis, Routine w reflex microscopic  2. Urinary urgency Continue gemtesa 75mg  daily  3. Nocturia Continue gemtesa 75mg  daily  4. Bladder calculus -The risks/benefits/alternatives to cystolithalopaxy was explained to the patient and he understands and wishes to proceed with surgery   No follow-ups on file.  Wilkie Aye, MD  [facility] 

## 2023-04-25 LAB — URINALYSIS, ROUTINE W REFLEX MICROSCOPIC
Bilirubin, UA: NEGATIVE
Glucose, UA: NEGATIVE
Ketones, UA: NEGATIVE
Leukocytes,UA: NEGATIVE
Nitrite, UA: NEGATIVE
Protein,UA: NEGATIVE
RBC, UA: NEGATIVE
Specific Gravity, UA: >1.03 — ABNORMAL HIGH (ref 1.005–1.030)
Urobilinogen, Ur: 0.2 mg/dL (ref 0.2–1.0)
pH, UA: 6 (ref 5.0–7.5)

## 2023-05-09 ENCOUNTER — Other Ambulatory Visit: Payer: Self-pay

## 2023-05-09 DIAGNOSIS — R972 Elevated prostate specific antigen [PSA]: Secondary | ICD-10-CM

## 2023-05-21 ENCOUNTER — Other Ambulatory Visit

## 2023-05-21 LAB — URINALYSIS, ROUTINE W REFLEX MICROSCOPIC
Bilirubin, UA: NEGATIVE
Glucose, UA: NEGATIVE
Ketones, UA: NEGATIVE
Leukocytes,UA: NEGATIVE
Nitrite, UA: NEGATIVE
Protein,UA: NEGATIVE
RBC, UA: NEGATIVE
Specific Gravity, UA: 1.025 (ref 1.005–1.030)
Urobilinogen, Ur: 0.2 mg/dL (ref 0.2–1.0)
pH, UA: 6 (ref 5.0–7.5)

## 2023-05-28 ENCOUNTER — Other Ambulatory Visit: Payer: Self-pay

## 2023-05-28 ENCOUNTER — Encounter (HOSPITAL_COMMUNITY)
Admission: RE | Admit: 2023-05-28 | Discharge: 2023-05-28 | Disposition: A | Discharge status: Home / Self Care | Source: Ambulatory Visit | Attending: Urology | Admitting: Urology

## 2023-05-28 ENCOUNTER — Encounter (HOSPITAL_COMMUNITY): Payer: Self-pay

## 2023-05-30 ENCOUNTER — Encounter (HOSPITAL_COMMUNITY): Payer: Self-pay | Admitting: Urology

## 2023-05-30 ENCOUNTER — Ambulatory Visit (HOSPITAL_BASED_OUTPATIENT_CLINIC_OR_DEPARTMENT_OTHER): Admitting: Anesthesiology

## 2023-05-30 ENCOUNTER — Ambulatory Visit (HOSPITAL_COMMUNITY)
Admission: RE | Admit: 2023-05-30 | Discharge: 2023-05-30 | Disposition: A | Discharge status: Home / Self Care | Attending: Urology | Admitting: Urology

## 2023-05-30 ENCOUNTER — Ambulatory Visit (HOSPITAL_COMMUNITY): Admitting: Anesthesiology

## 2023-05-30 ENCOUNTER — Encounter (HOSPITAL_COMMUNITY)
Admission: RE | Disposition: A | Discharge status: Home / Self Care | Payer: Self-pay | Source: Home / Self Care | Attending: Urology

## 2023-05-30 DIAGNOSIS — K219 Gastro-esophageal reflux disease without esophagitis: Secondary | ICD-10-CM | POA: Insufficient documentation

## 2023-05-30 DIAGNOSIS — N400 Enlarged prostate without lower urinary tract symptoms: Secondary | ICD-10-CM | POA: Diagnosis not present

## 2023-05-30 DIAGNOSIS — N210 Calculus in bladder: Secondary | ICD-10-CM

## 2023-05-30 DIAGNOSIS — I10 Essential (primary) hypertension: Secondary | ICD-10-CM | POA: Insufficient documentation

## 2023-05-30 DIAGNOSIS — Z87891 Personal history of nicotine dependence: Secondary | ICD-10-CM | POA: Insufficient documentation

## 2023-05-30 DIAGNOSIS — F419 Anxiety disorder, unspecified: Secondary | ICD-10-CM | POA: Insufficient documentation

## 2023-05-30 HISTORY — PX: HOLMIUM LASER APPLICATION: SHX5852

## 2023-05-30 HISTORY — PX: CYSTOSCOPY WITH LITHOLAPAXY: SHX1425

## 2023-05-30 SURGERY — CYSTOSCOPY, WITH BLADDER CALCULUS LITHOLAPAXY
Anesthesia: General | Site: Bladder

## 2023-05-30 MED ORDER — OXYCODONE HCL 5 MG PO TABS
5.0000 mg | ORAL_TABLET | Freq: Once | ORAL | Status: AC | PRN
  Administered 2023-05-30: 5 mg via ORAL
  Filled 2023-05-30: qty 1

## 2023-05-30 MED ORDER — FENTANYL CITRATE PF 50 MCG/ML IJ SOSY
25.0000 ug | PREFILLED_SYRINGE | INTRAMUSCULAR | Status: DC | PRN
  Administered 2023-05-30: 50 ug via INTRAVENOUS

## 2023-05-30 MED ORDER — DEXAMETHASONE SODIUM PHOSPHATE 10 MG/ML IJ SOLN
INTRAMUSCULAR | Status: DC | PRN
  Administered 2023-05-30: 10 mg via INTRAVENOUS

## 2023-05-30 MED ORDER — OXYCODONE HCL 5 MG/5ML PO SOLN: 5.0000 mg | Freq: Once | ORAL | Status: AC | PRN

## 2023-05-30 MED ORDER — VASOPRESSIN 20 UNIT/ML IV SOLN
INTRAVENOUS | Status: AC
  Filled 2023-05-30: qty 1

## 2023-05-30 MED ORDER — MIDAZOLAM HCL 2 MG/2ML IJ SOLN
INTRAMUSCULAR | Status: AC
  Filled 2023-05-30: qty 2

## 2023-05-30 MED ORDER — ONDANSETRON HCL 4 MG/2ML IJ SOLN: 4.0000 mg | Freq: Once | INTRAMUSCULAR | Status: DC | PRN

## 2023-05-30 MED ORDER — CHLORHEXIDINE GLUCONATE 0.12 % MT SOLN: 15.0000 mL | Freq: Once | OROMUCOSAL | Status: DC

## 2023-05-30 MED ORDER — PROPOFOL 10 MG/ML IV BOLUS
INTRAVENOUS | Status: DC | PRN
  Administered 2023-05-30: 200 mg via INTRAVENOUS

## 2023-05-30 MED ORDER — MIDAZOLAM HCL 2 MG/2ML IJ SOLN
INTRAMUSCULAR | Status: DC | PRN
  Administered 2023-05-30: 2 mg via INTRAVENOUS

## 2023-05-30 MED ORDER — PROPOFOL 10 MG/ML IV BOLUS
INTRAVENOUS | Status: AC
  Filled 2023-05-30: qty 20

## 2023-05-30 MED ORDER — CEFAZOLIN SODIUM-DEXTROSE 2-4 GM/100ML-% IV SOLN
INTRAVENOUS | Status: AC
  Filled 2023-05-30: qty 100

## 2023-05-30 MED ORDER — FENTANYL CITRATE PF 50 MCG/ML IJ SOSY
PREFILLED_SYRINGE | INTRAMUSCULAR | Status: AC
  Filled 2023-05-30: qty 1

## 2023-05-30 MED ORDER — CHLORHEXIDINE GLUCONATE 0.12 % MT SOLN
15.0000 mL | Freq: Once | OROMUCOSAL | Status: AC
  Administered 2023-05-30: 15 mL via OROMUCOSAL

## 2023-05-30 MED ORDER — HYDROCODONE-ACETAMINOPHEN 5-325 MG PO TABS: 1.0000 | ORAL_TABLET | Freq: Four times a day (QID) | ORAL | 0 refills | Status: AC | PRN

## 2023-05-30 MED ORDER — LACTATED RINGERS IV SOLN: INTRAVENOUS | Status: DC

## 2023-05-30 MED ORDER — CEFAZOLIN SODIUM-DEXTROSE 2-4 GM/100ML-% IV SOLN
2.0000 g | INTRAVENOUS | Status: AC
  Administered 2023-05-30: 2 g via INTRAVENOUS

## 2023-05-30 MED ORDER — WATER FOR IRRIGATION, STERILE IR SOLN
Status: DC | PRN
  Administered 2023-05-30: 500 mL

## 2023-05-30 MED ORDER — LIDOCAINE HCL (CARDIAC) PF 100 MG/5ML IV SOSY
PREFILLED_SYRINGE | INTRAVENOUS | Status: DC | PRN
  Administered 2023-05-30: 100 mg via INTRATRACHEAL

## 2023-05-30 MED ORDER — LACTATED RINGERS IV SOLN: INTRAVENOUS | Status: DC | PRN

## 2023-05-30 MED ORDER — FENTANYL CITRATE (PF) 100 MCG/2ML IJ SOLN
INTRAMUSCULAR | Status: AC
  Filled 2023-05-30: qty 2

## 2023-05-30 MED ORDER — FENTANYL CITRATE (PF) 100 MCG/2ML IJ SOLN
INTRAMUSCULAR | Status: DC | PRN
  Administered 2023-05-30: 100 ug via INTRAVENOUS

## 2023-05-30 MED ORDER — ORAL CARE MOUTH RINSE: 15.0000 mL | Freq: Once | OROMUCOSAL | Status: DC

## 2023-05-30 MED ORDER — SODIUM CHLORIDE 0.9 % IR SOLN
Status: DC | PRN
  Administered 2023-05-30: 3000 mL

## 2023-05-30 MED ORDER — ONDANSETRON HCL 4 MG/2ML IJ SOLN
INTRAMUSCULAR | Status: DC | PRN
  Administered 2023-05-30: 4 mg via INTRAVENOUS

## 2023-05-30 SURGICAL SUPPLY — 18 items
BAG DRAIN URO TABLE W/ADPT NS (BAG) ×2 IMPLANT
BAG HAMPER (MISCELLANEOUS) ×2 IMPLANT
BAG URINE DRAIN 2000ML AR STRL (UROLOGICAL SUPPLIES) ×2 IMPLANT
CATH FOLEY 3WAY 30CC 22FR (CATHETERS) IMPLANT
GLOVE BIO SURGEON STRL SZ7 (GLOVE) IMPLANT
GLOVE BIO SURGEON STRL SZ8 (GLOVE) ×2 IMPLANT
GLOVE BIOGEL PI IND STRL 7.0 (GLOVE) ×4 IMPLANT
GLOVE ECLIPSE 6.5 STRL STRAW (GLOVE) IMPLANT
GOWN STRL REUS W/TWL LRG LVL3 (GOWN DISPOSABLE) ×2 IMPLANT
GOWN STRL REUS W/TWL XL LVL3 (GOWN DISPOSABLE) ×2 IMPLANT
KIT TURNOVER CYSTO (KITS) ×2 IMPLANT
LASER FIBER DISP 1000U (UROLOGICAL SUPPLIES) IMPLANT
PACK CYSTO (CUSTOM PROCEDURE TRAY) ×2 IMPLANT
PAD ARMBOARD POSITIONER FOAM (MISCELLANEOUS) ×2 IMPLANT
PLUG CATH AND CAP STRL 200 (CATHETERS) IMPLANT
POSITIONER HEAD 8X9X4 ADT (SOFTGOODS) ×2 IMPLANT
SOL .9 NS 3000ML IRR UROMATIC (IV SOLUTION) ×2 IMPLANT
WATER STERILE IRR 500ML POUR (IV SOLUTION) ×2 IMPLANT

## 2023-05-30 NOTE — Anesthesia Preprocedure Evaluation (Addendum)
 Anesthesia Evaluation  Patient identified by MRN, date of birth, ID band Patient awake    Reviewed: Allergy & Precautions, H&P , NPO status , Patient's Chart, lab work & pertinent test results, reviewed documented beta blocker date and time   Airway Mallampati: II  TM Distance: >3 FB Neck ROM: full    Dental no notable dental hx.    Pulmonary former smoker   Pulmonary exam normal breath sounds clear to auscultation       Cardiovascular Exercise Tolerance: Good hypertension, negative cardio ROS  Rhythm:regular Rate:Normal     Neuro/Psych   Anxiety     negative neurological ROS  negative psych ROS   GI/Hepatic negative GI ROS, Neg liver ROS,GERD  ,,  Endo/Other  negative endocrine ROS    Renal/GU negative Renal ROS  negative genitourinary   Musculoskeletal   Abdominal   Peds  Hematology negative hematology ROS (+)   Anesthesia Other Findings   Reproductive/Obstetrics negative OB ROS                             Anesthesia Physical Anesthesia Plan  ASA: 2  Anesthesia Plan: General and General LMA   Post-op Pain Management:    Induction:   PONV Risk Score and Plan: Ondansetron  Airway Management Planned:   Additional Equipment:   Intra-op Plan:   Post-operative Plan:   Informed Consent: I have reviewed the patients History and Physical, chart, labs and discussed the procedure including the risks, benefits and alternatives for the proposed anesthesia with the patient or authorized representative who has indicated his/her understanding and acceptance.     Dental Advisory Given  Plan Discussed with: CRNA  Anesthesia Plan Comments:        Anesthesia Quick Evaluation

## 2023-05-30 NOTE — Anesthesia Procedure Notes (Signed)
 Procedure Name: LMA Insertion Date/Time: 05/30/2023 11:28 AM  Performed by: Shanon Payor, CRNAPre-anesthesia Checklist: Patient identified, Emergency Drugs available, Suction available, Patient being monitored and Timeout performed Patient Re-evaluated:Patient Re-evaluated prior to induction Oxygen Delivery Method: Circle system utilized Preoxygenation: Pre-oxygenation with 100% oxygen Induction Type: IV induction LMA: LMA inserted LMA Size: 5.0 Number of attempts: 1 Placement Confirmation: positive ETCO2, CO2 detector and breath sounds checked- equal and bilateral Tube secured with: Tape Dental Injury: Teeth and Oropharynx as per pre-operative assessment

## 2023-05-30 NOTE — H&P (Signed)
 HPI: [name] is a 68yo here for cystolithalopaxy  He negative MRI fusion biopsy. He was found to have a 2.4cm bladder calculus on imaging.    PMH:     Past Medical History:  Diagnosis Date   Anxiety     GERD (gastroesophageal reflux disease)     Hyperlipidemia            Surgical History:      Past Surgical History:  Procedure Laterality Date   ESOPHAGOGASTRODUODENOSCOPY (EGD) WITH PROPOFOL N/A 08/14/2022    Procedure: ESOPHAGOGASTRODUODENOSCOPY (EGD) WITH PROPOFOL;  Surgeon: Vida Rigger, MD;  Location: [facility];  Service: Gastroenterology;  Laterality: N/A;   ESOPHAGOGASTRODUODENOSCOPY (EGD) WITH PROPOFOL N/A 04/09/2023    Procedure: ESOPHAGOGASTRODUODENOSCOPY (EGD) WITH PROPOFOL;  Surgeon: Vida Rigger, MD;  Location: [facility];  Service: Gastroenterology;  Laterality: N/A;   KNEE ARTHROSCOPY Right     POLYPECTOMY   08/14/2022    Procedure: POLYPECTOMY;  Surgeon: Vida Rigger, MD;  Location: [facility];  Service: Gastroenterology;;   POLYPECTOMY   04/09/2023    Procedure: POLYPECTOMY;  Surgeon: Vida Rigger, MD;  Location: [facility];  Service: Gastroenterology;;   PROSTATE SURGERY              Home Medications:  Allergies as of 04/24/2023   No Known Allergies         Medication List           Accurate as of April 24, 2023  4:01 PM. If you have any questions, ask your nurse or doctor.              cholecalciferol 25 MCG (1000 UNIT) tablet Commonly known as: VITAMIN D3 Take 1,000 Units by mouth daily.    doxycycline 100 MG capsule Commonly known as: VIBRAMYCIN Take 1 capsule (100 mg total) by mouth every 12 (twelve) hours.    escitalopram 10 MG tablet Commonly known as: Lexapro Take 1 tablet (10 mg total) by mouth daily.    Gemtesa 75 MG Tabs Generic drug: Vibegron Take 1 tablet (75 mg total) by mouth daily.    multivitamin tablet Take 1 tablet by mouth daily.    Red Yeast Rice 600 MG Caps Take 1,200 mg by mouth daily.    vitamin C 1000 MG  tablet Take 1,000 mg by mouth daily.    Vitamin K2 100 MCG Tabs Take 100 mcg by mouth daily.    zinc sulfate (50mg  elemental zinc) 220 (50 Zn) MG capsule Take 220 mg by mouth daily.             Allergies:  Allergies  No Known Allergies     Family History: No family history on file.       Social History:  reports that he quit smoking about 9 years ago. His smoking use included cigarettes. He started smoking about 29 years ago. He has a 20 pack-year smoking history. He has never used smokeless tobacco. He reports current alcohol use. He reports that he does not use drugs.   ROS: All other review of systems were reviewed and are negative except what is noted above in HPI   Physical Exam: BP (!) 156/84   Pulse 75   Constitutional:  Alert and oriented, No acute distress. HEENT: [place] AT, moist mucus membranes.  Trachea midline, no masses. Cardiovascular: No clubbing, cyanosis, or edema. Respiratory: Normal respiratory effort, no increased work of breathing. GI: Abdomen is soft, nontender, nondistended, no abdominal masses GU: No CVA tenderness.  Lymph: No cervical  or inguinal lymphadenopathy. Skin: No rashes, bruises or suspicious lesions. Neurologic: Grossly intact, no focal deficits, moving all 4 extremities. Psychiatric: Normal mood and affect.   Laboratory Data: Recent Labs       Lab Results  Component Value Date    WBC 5.2 09/04/2022    HGB 12.8 (L) 09/04/2022    HCT 38.9 (L) 09/04/2022    MCV 90.4 09/04/2022    PLT 220.0 09/04/2022        Recent Labs       Lab Results  Component Value Date    CREATININE 0.77 09/04/2022        Recent Labs       Lab Results  Component Value Date    PSA 6.18 (H) 08/23/2021        Recent Labs  No results found for: "TESTOSTERONE"     Recent Labs  No results found for: "HGBA1C"     Urinalysis Labs (Brief)          Component Value Date/Time    APPEARANCEUR Clear 11/02/2022 1006    GLUCOSEU Negative  11/02/2022 1006    BILIRUBINUR Negative 11/02/2022 1006    PROTEINUR Negative 11/02/2022 1006    NITRITE Negative 11/02/2022 1006    LEUKOCYTESUR Negative 11/02/2022 1006        Recent Labs       Lab Results  Component Value Date    LABMICR Comment 11/02/2022    WBCUA 6-10 (A) 11/01/2021    LABEPIT 0-10 11/01/2021    BACTERIA Few (A) 11/01/2021        Pertinent Imaging:   No results found for this or any previous visit.   No results found for this or any previous visit.   No results found for this or any previous visit.   No results found for this or any previous visit.   No results found for this or any previous visit.   No results found for this or any previous visit.   No results found for this or any previous visit.   No results found for this or any previous visit.     Assessment & Plan:     1. Bladder calculus -The risks/benefits/alternatives to cystolithalopaxy was explained to the patient and he understands and wishes to proceed with surgery

## 2023-05-30 NOTE — Anesthesia Postprocedure Evaluation (Signed)
 Anesthesia Post Note  Patient: George Love  Procedure(s) Performed: CYSTOSCOPY, WITH BLADDER CALCULUS LITHOLAPAXY (Bladder) HOLMIUM LASER APPLICATION (Bladder)  Patient location during evaluation: Phase II Anesthesia Type: General Level of consciousness: awake Pain management: pain level controlled Vital Signs Assessment: post-procedure vital signs reviewed and stable Respiratory status: spontaneous breathing and respiratory function stable Cardiovascular status: blood pressure returned to baseline and stable Postop Assessment: no headache and no apparent nausea or vomiting Anesthetic complications: no Comments: Late entry   No notable events documented.   Last Vitals:  Vitals:   05/30/23 1245 05/30/23 1314  BP: (!) 159/86 (!) 171/85  Pulse: (!) 57 63  Resp: 12 14  Temp:  36.6 C  SpO2: 97% 98%    Last Pain:  Vitals:   05/30/23 1314  TempSrc: Oral  PainSc: 4                  Windell Norfolk

## 2023-05-30 NOTE — Transfer of Care (Signed)
 Immediate Anesthesia Transfer of Care Note  Patient: FROYLAN HOBBY  Procedure(s) Performed: CYSTOSCOPY, WITH BLADDER CALCULUS LITHOLAPAXY (Bladder) HOLMIUM LASER APPLICATION (Bladder)  Patient Location: PACU  Anesthesia Type:General  Level of Consciousness: awake, alert , oriented, and patient cooperative  Airway & Oxygen Therapy: Patient Spontanous Breathing  Post-op Assessment: Report given to RN, Post -op Vital signs reviewed and stable, and Patient moving all extremities X 4  Post vital signs: Reviewed and stable  Last Vitals:  Vitals Value Taken Time  BP 171/80 05/30/23 1215  Temp 36.6 C 05/30/23 1215  Pulse 55 05/30/23 1229  Resp 8 05/30/23 1229  SpO2 95 % 05/30/23 1229  Vitals shown include unfiled device data.  Last Pain:  Vitals:   05/30/23 1225  PainSc: 6          Complications: No notable events documented.

## 2023-05-30 NOTE — Op Note (Signed)
 Preoperative diagnosis: BPHr, bladder calculus  Postoperative diagnosis: same  Procedure: 1 cystoscopy 2. cystolithalopaxy for a stone greater than 2.5cm  Attending: Wilkie Aye  Anesthesia: General  Estimated blood loss: Minimal  Drains: 22 french foley  Specimens: 1. Bladder calculus  Antibiotics: ancef  Findings:  Ureteral orifices in normal anatomic location.  2.8cm bladder calculus  Indications: Patient is a 68 year old male with a history of BPH and bladder calculus.  After discussing treatment options, they decided proceed with cystolithalopaxy.  Procedure in detail: The patient was brought to the operating room and a brief timeout was done to ensure correct patient, correct procedure, correct site.  General anesthesia was administered patient was placed in dorsal lithotomy position.  Their genitalia was then prepped and draped in usual sterile fashion.  A rigid 22 French cystoscope was passed in the urethra and the bladder.  Bladder was inspected and we noted no masses or lesions.  the ureteral orifices were in the normal orthotopic locations. Using the 1000nm laser fiber the bladder calculus was fragmented and the fragments were then irrigated from the bladder. The stone fragments were the sent for analysis. The bladder was then drained, a 22 french foley was placed, and this concluded the procedure which was well tolerated by patient.  Complications: None  Condition: Stable, extubated, transferred to PACU  Plan: Patient is to be discharge home. He will followup in 5-7 days for a voiding trial

## 2023-05-31 ENCOUNTER — Encounter (HOSPITAL_COMMUNITY): Payer: Self-pay | Admitting: Urology

## 2023-05-31 ENCOUNTER — Encounter: Payer: Self-pay | Admitting: Urology

## 2023-06-02 ENCOUNTER — Other Ambulatory Visit: Payer: Self-pay

## 2023-06-02 ENCOUNTER — Emergency Department (HOSPITAL_COMMUNITY)

## 2023-06-02 ENCOUNTER — Encounter (HOSPITAL_COMMUNITY): Payer: Self-pay

## 2023-06-02 ENCOUNTER — Emergency Department (HOSPITAL_COMMUNITY)
Admission: EM | Admit: 2023-06-02 | Discharge: 2023-06-02 | Disposition: A | Discharge status: Home / Self Care | Attending: Emergency Medicine | Admitting: Emergency Medicine

## 2023-06-02 DIAGNOSIS — Z960 Presence of urogenital implants: Secondary | ICD-10-CM | POA: Diagnosis not present

## 2023-06-02 DIAGNOSIS — N390 Urinary tract infection, site not specified: Secondary | ICD-10-CM | POA: Diagnosis not present

## 2023-06-02 DIAGNOSIS — R3914 Feeling of incomplete bladder emptying: Secondary | ICD-10-CM | POA: Diagnosis present

## 2023-06-02 DIAGNOSIS — R000 Tachycardia, unspecified: Secondary | ICD-10-CM | POA: Diagnosis not present

## 2023-06-02 LAB — URINALYSIS, W/ REFLEX TO CULTURE (INFECTION SUSPECTED)
Bacteria, UA: NONE SEEN
Bilirubin Urine: NEGATIVE
Glucose, UA: NEGATIVE mg/dL
Ketones, ur: NEGATIVE mg/dL
Leukocytes,Ua: NEGATIVE
Nitrite: POSITIVE — AB
Protein, ur: 100 mg/dL — AB
RBC / HPF: >50 RBC/hpf (ref 0–5)
Specific Gravity, Urine: 1.021 (ref 1.005–1.030)
WBC, UA: >50 WBC/hpf (ref 0–5)
pH: 5 (ref 5.0–8.0)

## 2023-06-02 LAB — CBC WITH DIFFERENTIAL/PLATELET
Abs Immature Granulocytes: 0.03 10*3/uL (ref 0.00–0.07)
Basophils Absolute: 0.1 10*3/uL (ref 0.0–0.1)
Basophils Relative: 0 %
Eosinophils Absolute: 0 10*3/uL (ref 0.0–0.5)
Eosinophils Relative: 0 %
HCT: 43.9 % (ref 39.0–52.0)
Hemoglobin: 14.3 g/dL (ref 13.0–17.0)
Immature Granulocytes: 0 %
Lymphocytes Relative: 5 %
Lymphs Abs: 0.6 10*3/uL — ABNORMAL LOW (ref 0.7–4.0)
MCH: 29.8 pg (ref 26.0–34.0)
MCHC: 32.6 g/dL (ref 30.0–36.0)
MCV: 91.5 fL (ref 80.0–100.0)
Monocytes Absolute: 0.6 10*3/uL (ref 0.1–1.0)
Monocytes Relative: 5 %
Neutro Abs: 10.3 10*3/uL — ABNORMAL HIGH (ref 1.7–7.7)
Neutrophils Relative %: 90 %
Platelets: 267 10*3/uL (ref 150–400)
RBC: 4.8 MIL/uL (ref 4.22–5.81)
RDW: 12.1 % (ref 11.5–15.5)
WBC: 11.5 10*3/uL — ABNORMAL HIGH (ref 4.0–10.5)
nRBC: 0 % (ref 0.0–0.2)

## 2023-06-02 LAB — COMPREHENSIVE METABOLIC PANEL WITH GFR
ALT: 20 U/L (ref 0–44)
AST: 21 U/L (ref 15–41)
Albumin: 4.1 g/dL (ref 3.5–5.0)
Alkaline Phosphatase: 61 U/L (ref 38–126)
Anion gap: 16 — ABNORMAL HIGH (ref 5–15)
BUN: 17 mg/dL (ref 8–23)
CO2: 21 mmol/L — ABNORMAL LOW (ref 22–32)
Calcium: 9.7 mg/dL (ref 8.9–10.3)
Chloride: 101 mmol/L (ref 98–111)
Creatinine, Ser: 0.98 mg/dL (ref 0.61–1.24)
GFR, Estimated: >60 mL/min (ref >60)
Glucose, Bld: 163 mg/dL — ABNORMAL HIGH (ref 70–99)
Potassium: 3.5 mmol/L (ref 3.5–5.1)
Sodium: 138 mmol/L (ref 135–145)
Total Bilirubin: 1.5 mg/dL — ABNORMAL HIGH (ref 0.0–1.2)
Total Protein: 7.5 g/dL (ref 6.5–8.1)

## 2023-06-02 LAB — I-STAT CG4 LACTIC ACID, ED: Lactic Acid, Venous: 1.3 mmol/L (ref 0.5–1.9)

## 2023-06-02 MED ORDER — SODIUM CHLORIDE 0.9 % IV SOLN
1.0000 g | Freq: Once | INTRAVENOUS | Status: AC
  Administered 2023-06-02: 1 g via INTRAVENOUS
  Filled 2023-06-02: qty 10

## 2023-06-02 MED ORDER — POLYETHYLENE GLYCOL 3350 17 G PO PACK: 17.0000 g | PACK | Freq: Every day | ORAL | 0 refills | Status: DC

## 2023-06-02 MED ORDER — LACTATED RINGERS IV BOLUS
1000.0000 mL | Freq: Once | INTRAVENOUS | Status: AC
  Administered 2023-06-02: 1000 mL via INTRAVENOUS

## 2023-06-02 MED ORDER — SULFAMETHOXAZOLE-TRIMETHOPRIM 800-160 MG PO TABS: 1.0000 | ORAL_TABLET | Freq: Two times a day (BID) | ORAL | 0 refills | Status: AC

## 2023-06-02 MED ORDER — HYOSCYAMINE SULFATE 0.125 MG PO TBDP: 0.1250 mg | ORAL_TABLET | ORAL | 0 refills | Status: DC | PRN

## 2023-06-02 NOTE — ED Triage Notes (Signed)
 Patient had bladder stone removed on Thursday and developed a fever last night 101.5.  Reports generalized bodyaches. Patient does have foley in place and reports it is leaking around the tip of his penis and a lot of pressure when he feels like he has to urinate.  Blood noted in catheter, urine is dark pink in color.

## 2023-06-02 NOTE — ED Provider Notes (Signed)
  EMERGENCY DEPARTMENT AT [facility] Provider Note   CSN: [id number] Arrival date & time: 06/02/23  1117     History  Chief Complaint  Patient presents with   Post-op Problem    George Love is a 68 y.o. male.  HPI  68 year old man presents today after cystoscopy for bladder calculus with laser application on Thursday.  Patient states that he had catheter placed postsurgery.  Yesterday he had fever to 101.5.  He has been unable to void and has had urine coming out around the catheter.  He took Tylenol last night but continued to be febrile through today.  He has had some generalized bodyaches.  He denies chest pain shortness of breath, nausea, vomiting, or diarrhea.    Home Medications Prior to Admission medications   Medication Sig Start Date End Date Taking? Authorizing Provider  hyoscyamine (ANASPAZ) 0.125 MG TBDP disintergrating tablet Place 1 tablet (0.125 mg total) under the tongue every 4 (four) hours as needed. 06/02/23  Yes Auston Blush, MD  polyethylene glycol (MIRALAX) 17 g packet Take 17 g by mouth daily. 06/02/23  Yes Auston Blush, MD  sulfamethoxazole-trimethoprim (BACTRIM DS) 800-160 MG tablet Take 1 tablet by mouth 2 (two) times daily for 14 days. 06/02/23 06/16/23 Yes Auston Blush, MD  Ascorbic Acid (VITAMIN C) 1000 MG tablet Take 1,000 mg by mouth daily.    [provider]  cholecalciferol (VITAMIN D3) 25 MCG (1000 UNIT) tablet Take 1,000 Units by mouth daily.    [provider]  escitalopram (LEXAPRO) 10 MG tablet Take 1 tablet (10 mg total) by mouth daily. 01/10/23   Saguier, Gaylin Ke, PA-C  HYDROcodone-acetaminophen (NORCO/VICODIN) 5-325 MG tablet Take 1 tablet by mouth every 6 (six) hours as needed for up to 5 days for moderate pain (pain score 4-6) or severe pain (pain score 7-10). 05/30/23 06/04/23  Marco Severs, MD  Menatetrenone (VITAMIN K2) 100 MCG TABS Take 100 mcg by mouth daily.    [provider]   Multiple Vitamin (MULTIVITAMIN) tablet Take 1 tablet by mouth daily.    [provider]  omeprazole (PRILOSEC OTC) 20 MG tablet Take 20 mg by mouth daily.    [provider]  Red Yeast Rice 600 MG CAPS Take 1,200 mg by mouth daily.    [provider]  zinc sulfate 220 (50 Zn) MG capsule Take 220 mg by mouth daily.    [provider]      Allergies    Patient has no known allergies.    Review of Systems   Review of Systems  Physical Exam Updated Vital Signs BP (!) 151/89 (BP Location: Left Arm)   Pulse (!) 109   Temp 99 F (37.2 C)   Resp 18   Ht 1.829 m (6')   Wt 110.7 kg   SpO2 96%   BMI 33.09 kg/m  Physical Exam Vitals reviewed.  Constitutional:      General: He is not in acute distress.    Appearance: Normal appearance. He is not ill-appearing.  HENT:     Head: Normocephalic.     Right Ear: External ear normal.     Left Ear: External ear normal.     Nose: Nose normal.     Mouth/Throat:     Pharynx: Oropharynx is clear.  Eyes:     Extraocular Movements: Extraocular movements intact.     Pupils: Pupils are equal, round, and reactive to light.  Cardiovascular:     Rate  and Rhythm: Regular rhythm. Tachycardia present.     Pulses: Normal pulses.  Pulmonary:     Effort: Pulmonary effort is normal.     Breath sounds: Normal breath sounds.  Abdominal:     General: Abdomen is flat. Bowel sounds are normal.  Genitourinary:    Comments: Catheter in place with some urine leaked around Musculoskeletal:        General: Normal range of motion.     Cervical back: Normal range of motion.  Skin:    General: Skin is warm and dry.     Capillary Refill: Capillary refill takes less than 2 seconds.  Neurological:     General: No focal deficit present.     Mental Status: He is alert.  Psychiatric:        Mood and Affect: Mood normal.     ED Results / Procedures / Treatments   Labs (all labs ordered are listed, but only abnormal results  are displayed) Labs Reviewed  COMPREHENSIVE METABOLIC PANEL WITH GFR - Abnormal; Notable for the following components:      Result Value   CO2 21 (*)    Glucose, Bld 163 (*)    Total Bilirubin 1.5 (*)    Anion gap 16 (*)    All other components within normal limits  CBC WITH DIFFERENTIAL/PLATELET - Abnormal; Notable for the following components:   WBC 11.5 (*)    Neutro Abs 10.3 (*)    Lymphs Abs 0.6 (*)    All other components within normal limits  URINALYSIS, W/ REFLEX TO CULTURE (INFECTION SUSPECTED) - Abnormal; Notable for the following components:   Color, Urine RED (*)    APPearance HAZY (*)    Hgb urine dipstick LARGE (*)    Protein, ur 100 (*)    Nitrite POSITIVE (*)    All other components within normal limits  CULTURE, BLOOD (ROUTINE X 2)  CULTURE, BLOOD (ROUTINE X 2)  URINE CULTURE  I-STAT CG4 LACTIC ACID, ED    EKG None  Radiology DG Chest 2 View Result Date: 06/02/2023 CLINICAL DATA:  Generalized body ache.  Fever. EXAM: CHEST - 2 VIEW COMPARISON:  04/02/2022 FINDINGS: Minimal atelectasis or scarring at the left base is similar to prior. Lungs otherwise clear. Cardiopericardial silhouette is at upper limits of normal for size. No acute bony abnormality. IMPRESSION: No active cardiopulmonary disease. Electronically Signed   By: Donnal Fusi M.D.   On: 06/02/2023 12:43    Procedures Procedures    Medications Ordered in ED Medications  lactated ringers bolus 1,000 mL (has no administration in time range)  cefTRIAXone (ROCEPHIN) 1 g in sodium chloride 0.9 % 100 mL IVPB (0 g Intravenous Stopped 06/02/23 1408)    ED Course/ Medical Decision Making/ A&P Clinical Course as of 06/02/23 1422  Sun Jun 02, 2023  1247 Chest x-Leoma Folds reviewed interpreted no evidence of acute abnormality radiologist interpretation concurs [DR]  1247 CBC significant for leukocytosis 11,500 with left shift and 90% neutrophils [DR]  1248 Lactic acid normal at 1.3 [DR]    Clinical Course  User Index [DR] Auston Blush, MD                                 Medical Decision Making Amount and/or Complexity of Data Reviewed Labs: ordered. Radiology: ordered.   68 year old male with recent urologic procedure, Foley cath in place since procedure presents today with fever, leaking catheter, and generalized  bodyaches. Patient being evaluated here with blood cultures, labs, urinalysis chest x-Sherice Ijames.  Patient has leukocytosis with left shift of 90%.  He has received IV antibiotics of Rocephin. Current urine pending and culture ordered Patient ordered to have Foley catheter removed and replaced 2:22 PM Discussed in cath with Dr. Cathi Cluster, on-call for urology.  Patient is asymptomatic after having catheter replaced.  Patient did not have bladder scan obtained prior to replacement of catheter.  He had some Kool-Aid colored urine out with catheter placement. He remains hemodynamically stable here in the ED. He is not febrile here although he has been taking acetaminophen. Rocephin IV given here in the ED. Urinalysis greater than 50 white blood cells and greater than 50 red blood cells. Patient would prefer to go home. He is feeling well with the catheter replaced. Patient will be started on Bactrim, hyoscyamine, and MiraLAX as per discussion with Dr. Cathi Cluster. Patient advised to return if having any worsening symptoms.  The patient advised to call Dr. Mozell Arias office tomorrow for recheck and follow-up.       Final Clinical Impression(s) / ED Diagnoses Final diagnoses:  Urinary tract infection associated with indwelling urethral catheter, initial encounter [facility])    Rx / DC Orders ED Discharge Orders          Ordered    sulfamethoxazole-trimethoprim (BACTRIM DS) 800-160 MG tablet  2 times daily        06/02/23 1421    hyoscyamine (ANASPAZ) 0.125 MG TBDP disintergrating tablet  Every 4 hours PRN        06/02/23 1421    polyethylene glycol (MIRALAX) 17 g packet  Daily         06/02/23 1421              Auston Blush, MD 06/02/23 1422

## 2023-06-02 NOTE — Discharge Instructions (Addendum)
 Please get your medications filled today and take as prescribed Return to the emergency department if you are having any new or worsening symptoms, especially weakness, worsening fever, nausea, or vomiting. Call Dr. Mozell Arias office in the morning for recheck tomorrow

## 2023-06-03 ENCOUNTER — Telehealth: Payer: Self-pay

## 2023-06-03 ENCOUNTER — Encounter: Payer: Self-pay | Admitting: Urology

## 2023-06-03 NOTE — Telephone Encounter (Signed)
 Patient states that he is currently on antibiotics per ER doctor. Hospital catheter fell out on its own and he has been voiding fine. Patient states urine has cleared up a lot.  Patient advised to complete course of antibiotics and increase fluid intake. If patient goes into urinary retention patient is advised to contact the office or go to ER. Patient voiced understanding and will keep scheduled post op follow up on 4/30.

## 2023-06-04 ENCOUNTER — Encounter

## 2023-06-05 LAB — URINE CULTURE: Culture: >=100000 — AB

## 2023-06-06 ENCOUNTER — Telehealth (HOSPITAL_BASED_OUTPATIENT_CLINIC_OR_DEPARTMENT_OTHER): Payer: Self-pay

## 2023-06-06 NOTE — Telephone Encounter (Signed)
 Post ED Visit - Positive Culture Follow-up  Culture report reviewed by antimicrobial stewardship pharmacist: [facility] [x]  Mamie Searles, Pharm.D. BCCCP []  Skeet Duke, Pharm.D., BCPS AQ-ID []  Leslee Rase, Pharm.D., BCPS []  Garland Junk, [address].D., BCPS []  [place], [address].D., BCPS, AAHIVP []  Alcide Aly, Pharm.D., BCPS, AAHIVP []  Jerri Morale, PharmD, BCPS []  Graham Laws, PharmD, BCPS []  Cleda Curly, PharmD, BCPS []  Tamar Fairly, PharmD []  Ballard Levels, PharmD, BCPS []  Ollen Beverage, PharmD  [facility] []  Arlyne Bering, PharmD []  Sherryle Don, PharmD []  Van Gelinas, PharmD []  Delila Felty, Rph []  Luna [place]) Cleora Daft, PharmD []  Augustina Block, PharmD []  Arie Kurtz, PharmD []  Sharlyn Deaner, PharmD []  Agnes Hose, PharmD []  Kendall Pauls, PharmD []  Gladstone Lamer, PharmD []  Armanda Bern, PharmD []  Tera Fellows, PharmD   Positive urine culture Treated with Sulfamethoxazole-Trimethoprim, organism sensitive to the same and no further patient follow-up is required at this time.  Delena Feil 06/06/2023, 12:59 PM

## 2023-06-07 LAB — CULTURE, BLOOD (ROUTINE X 2)
Culture: NO GROWTH
Culture: NO GROWTH
Special Requests: ADEQUATE

## 2023-06-07 LAB — CALCULI, WITH PHOTOGRAPH (CLINICAL LAB)
Uric Acid Calculi: 100 %
Weight Calculi: 2923 mg

## 2023-06-11 ENCOUNTER — Other Ambulatory Visit

## 2023-06-12 ENCOUNTER — Encounter: Admitting: Urology

## 2023-06-19 ENCOUNTER — Ambulatory Visit: Admitting: Urology

## 2023-06-19 VITALS — BP 113/70 | HR 75

## 2023-06-19 DIAGNOSIS — N210 Calculus in bladder: Secondary | ICD-10-CM | POA: Diagnosis not present

## 2023-06-19 DIAGNOSIS — N200 Calculus of kidney: Secondary | ICD-10-CM

## 2023-06-19 NOTE — Progress Notes (Signed)
 06/19/2023 8:49 AM   Adela Holter Feb 19, 1956 [id number]  Referring provider: Sylvia Everts, PA-C [address] [place],  [place] [address]  Followup bladder calculus   HPI: Mr Illes is a 20JY here for followup for bladder calculus. He had hematuria this past weekend and then passed a small stone. Since then the hematuria has resolved. He has nocturia 2-3x. Stone composition was 100% uric acid   PMH: Past Medical History:  Diagnosis Date   Anxiety    GERD (gastroesophageal reflux disease)    Hyperlipidemia     Surgical History: Past Surgical History:  Procedure Laterality Date   CYSTOSCOPY WITH LITHOLAPAXY N/A 05/30/2023   Procedure: CYSTOSCOPY, WITH BLADDER CALCULUS LITHOLAPAXY;  Surgeon: Marco Severs, MD;  Location: [facility];  Service: Urology;  Laterality: N/A;   ESOPHAGOGASTRODUODENOSCOPY (EGD) WITH PROPOFOL  N/A 08/14/2022   Procedure: ESOPHAGOGASTRODUODENOSCOPY (EGD) WITH PROPOFOL ;  Surgeon: Ozell Blunt, MD;  Location: [facility];  Service: Gastroenterology;  Laterality: N/A;   ESOPHAGOGASTRODUODENOSCOPY (EGD) WITH PROPOFOL  N/A 04/09/2023   Procedure: ESOPHAGOGASTRODUODENOSCOPY (EGD) WITH PROPOFOL ;  Surgeon: Ozell Blunt, MD;  Location: [facility];  Service: Gastroenterology;  Laterality: N/A;   HOLMIUM LASER APPLICATION N/A 05/30/2023   Procedure: HOLMIUM LASER APPLICATION;  Surgeon: Marco Severs, MD;  Location: [facility];  Service: Urology;  Laterality: N/A;   KNEE ARTHROSCOPY Right    POLYPECTOMY  08/14/2022   Procedure: POLYPECTOMY;  Surgeon: Ozell Blunt, MD;  Location: [facility];  Service: Gastroenterology;;   POLYPECTOMY  04/09/2023   Procedure: POLYPECTOMY;  Surgeon: Ozell Blunt, MD;  Location: [facility];  Service: Gastroenterology;;   PROSTATE SURGERY     x4    Home Medications:  Allergies as of 06/19/2023   No Known Allergies      Medication List        Accurate as of June 19, 2023  8:49 AM. If you have any  questions, ask your nurse or doctor.          STOP taking these medications    hyoscyamine  0.125 MG Tbdp disintergrating tablet Commonly known as: ANASPAZ    polyethylene glycol 17 g packet Commonly known as: MiraLax        TAKE these medications    cholecalciferol 25 MCG (1000 UNIT) tablet Commonly known as: VITAMIN D3 Take 1,000 Units by mouth daily.   escitalopram  10 MG tablet Commonly known as: Lexapro  Take 1 tablet (10 mg total) by mouth daily.   multivitamin tablet Take 1 tablet by mouth daily.   omeprazole 20 MG tablet Commonly known as: PRILOSEC OTC Take 20 mg by mouth daily.   Red Yeast Rice 600 MG Caps Take 1,200 mg by mouth daily.   vitamin C 1000 MG tablet Take 1,000 mg by mouth daily.   Vitamin K2 100 MCG Tabs Take 100 mcg by mouth daily.   zinc sulfate (50mg  elemental zinc) 220 (50 Zn) MG capsule Take 220 mg by mouth daily.        Allergies: No Known Allergies  Family History: No family history on file.  Social History:  reports that he quit smoking about 9 years ago. His smoking use included cigarettes. He started smoking about 29 years ago. He has a 20 pack-year smoking history. He has never used smokeless tobacco. He reports current alcohol use. He reports that he does not use drugs.  ROS: All other review of systems were reviewed and are negative except what is noted above in HPI  Physical Exam: BP 113/70  Pulse 75   Constitutional:  Alert and oriented, No acute distress. HEENT: [place] AT, moist mucus membranes.  Trachea midline, no masses. Cardiovascular: No clubbing, cyanosis, or edema. Respiratory: Normal respiratory effort, no increased work of breathing. GI: Abdomen is soft, nontender, nondistended, no abdominal masses GU: No CVA tenderness.  Lymph: No cervical or inguinal lymphadenopathy. Skin: No rashes, bruises or suspicious lesions. Neurologic: Grossly intact, no focal deficits, moving all 4 extremities. Psychiatric: Normal  mood and affect.  Laboratory Data: Lab Results  Component Value Date   WBC 11.5 (H) 06/02/2023   HGB 14.3 06/02/2023   HCT 43.9 06/02/2023   MCV 91.5 06/02/2023   PLT 267 06/02/2023    Lab Results  Component Value Date   CREATININE 0.98 06/02/2023    Lab Results  Component Value Date   PSA 6.18 (H) 08/23/2021    No results found for: "TESTOSTERONE"  No results found for: "HGBA1C"  Urinalysis    Component Value Date/Time   COLORURINE RED (A) 06/02/2023 1230   APPEARANCEUR HAZY (A) 06/02/2023 1230   APPEARANCEUR Clear 05/21/2023 0940   LABSPEC 1.021 06/02/2023 1230   PHURINE 5.0 06/02/2023 1230   GLUCOSEU NEGATIVE 06/02/2023 1230   HGBUR LARGE (A) 06/02/2023 1230   BILIRUBINUR NEGATIVE 06/02/2023 1230   BILIRUBINUR Negative 05/21/2023 0940   KETONESUR NEGATIVE 06/02/2023 1230   PROTEINUR 100 (A) 06/02/2023 1230   NITRITE POSITIVE (A) 06/02/2023 1230   LEUKOCYTESUR NEGATIVE 06/02/2023 1230    Lab Results  Component Value Date   LABMICR Comment 05/21/2023   WBCUA 6-10 (A) 11/01/2021   LABEPIT 0-10 11/01/2021   BACTERIA NONE SEEN 06/02/2023    Pertinent Imaging:  No results found for this or any previous visit.  No results found for this or any previous visit.  No results found for this or any previous visit.  No results found for this or any previous visit.  No results found for this or any previous visit.  No results found for this or any previous visit.  No results found for this or any previous visit.  No results found for this or any previous visit.   Assessment & Plan:    1. Calculus of bladder (Primary) Uric acid lab today, we will start sodium bicarb versus potassium citrate. Followup 6 weeks with renal US    No follow-ups on file.  Johnie Nailer, MD  [facility] [place]

## 2023-06-20 LAB — URIC ACID: Uric Acid: 6.1 mg/dL (ref 3.8–8.4)

## 2023-06-25 ENCOUNTER — Encounter: Payer: Self-pay | Admitting: Urology

## 2023-06-25 NOTE — Patient Instructions (Signed)

## 2023-06-27 ENCOUNTER — Other Ambulatory Visit: Payer: Self-pay

## 2023-06-27 ENCOUNTER — Encounter: Payer: Self-pay | Admitting: Urology

## 2023-06-27 DIAGNOSIS — N200 Calculus of kidney: Secondary | ICD-10-CM

## 2023-06-28 ENCOUNTER — Encounter: Admitting: Urology

## 2023-06-28 MED ORDER — DOXYCYCLINE HYCLATE 100 MG PO CAPS: 100.0000 mg | ORAL_CAPSULE | Freq: Two times a day (BID) | ORAL | 0 refills | Status: DC

## 2023-06-29 LAB — URINALYSIS, ROUTINE W REFLEX MICROSCOPIC
Bilirubin, UA: NEGATIVE
Glucose, UA: NEGATIVE
Ketones, UA: NEGATIVE
Nitrite, UA: POSITIVE — AB
Specific Gravity, UA: 1.023 (ref 1.005–1.030)
Urobilinogen, Ur: 0.2 mg/dL (ref 0.2–1.0)
pH, UA: 5.5 (ref 5.0–7.5)

## 2023-06-29 LAB — MICROSCOPIC EXAMINATION
Casts: NONE SEEN /LPF
WBC, UA: >30 /HPF — AB (ref 0–5)

## 2023-06-30 ENCOUNTER — Emergency Department (HOSPITAL_COMMUNITY)
Admission: EM | Admit: 2023-06-30 | Discharge: 2023-07-01 | Disposition: A | Discharge status: Home / Self Care | Attending: Emergency Medicine | Admitting: Emergency Medicine

## 2023-06-30 ENCOUNTER — Encounter (HOSPITAL_COMMUNITY): Payer: Self-pay

## 2023-06-30 ENCOUNTER — Emergency Department (HOSPITAL_COMMUNITY)

## 2023-06-30 ENCOUNTER — Other Ambulatory Visit: Payer: Self-pay

## 2023-06-30 DIAGNOSIS — R300 Dysuria: Secondary | ICD-10-CM | POA: Diagnosis present

## 2023-06-30 DIAGNOSIS — N390 Urinary tract infection, site not specified: Secondary | ICD-10-CM | POA: Diagnosis not present

## 2023-06-30 DIAGNOSIS — R509 Fever, unspecified: Secondary | ICD-10-CM | POA: Insufficient documentation

## 2023-06-30 DIAGNOSIS — Z87891 Personal history of nicotine dependence: Secondary | ICD-10-CM | POA: Diagnosis not present

## 2023-06-30 HISTORY — DX: Calculus in bladder: N21.0

## 2023-06-30 LAB — BASIC METABOLIC PANEL WITH GFR
Anion gap: 10 (ref 5–15)
BUN: 23 mg/dL (ref 8–23)
CO2: 23 mmol/L (ref 22–32)
Calcium: 9.5 mg/dL (ref 8.9–10.3)
Chloride: 105 mmol/L (ref 98–111)
Creatinine, Ser: 0.92 mg/dL (ref 0.61–1.24)
GFR, Estimated: >60 mL/min (ref >60)
Glucose, Bld: 97 mg/dL (ref 70–99)
Potassium: 3.5 mmol/L (ref 3.5–5.1)
Sodium: 138 mmol/L (ref 135–145)

## 2023-06-30 LAB — CBC
HCT: 36.2 % — ABNORMAL LOW (ref 39.0–52.0)
Hemoglobin: 12.2 g/dL — ABNORMAL LOW (ref 13.0–17.0)
MCH: 30.7 pg (ref 26.0–34.0)
MCHC: 33.7 g/dL (ref 30.0–36.0)
MCV: 91 fL (ref 80.0–100.0)
Platelets: 217 10*3/uL (ref 150–400)
RBC: 3.98 MIL/uL — ABNORMAL LOW (ref 4.22–5.81)
RDW: 12 % (ref 11.5–15.5)
WBC: 8.8 10*3/uL (ref 4.0–10.5)
nRBC: 0 % (ref 0.0–0.2)

## 2023-06-30 LAB — URINALYSIS, ROUTINE W REFLEX MICROSCOPIC
Bilirubin Urine: NEGATIVE
Glucose, UA: NEGATIVE mg/dL
Hgb urine dipstick: NEGATIVE
Ketones, ur: NEGATIVE mg/dL
Nitrite: NEGATIVE
Protein, ur: NEGATIVE mg/dL
Specific Gravity, Urine: 1.021 (ref 1.005–1.030)
pH: 5 (ref 5.0–8.0)

## 2023-06-30 LAB — RESP PANEL BY RT-PCR (RSV, FLU A&B, COVID)  RVPGX2
Influenza A by PCR: NEGATIVE
Influenza B by PCR: NEGATIVE
Resp Syncytial Virus by PCR: NEGATIVE
SARS Coronavirus 2 by RT PCR: NEGATIVE

## 2023-06-30 LAB — I-STAT CG4 LACTIC ACID, ED: Lactic Acid, Venous: 1.3 mmol/L (ref 0.5–1.9)

## 2023-06-30 MED ORDER — SODIUM CHLORIDE 0.9 % IV SOLN
2.0000 g | Freq: Once | INTRAVENOUS | Status: AC
  Administered 2023-06-30: 2 g via INTRAVENOUS
  Filled 2023-06-30: qty 12.5

## 2023-06-30 MED ORDER — ACETAMINOPHEN 500 MG PO TABS
1000.0000 mg | ORAL_TABLET | Freq: Once | ORAL | Status: AC
  Administered 2023-06-30: 1000 mg via ORAL
  Filled 2023-06-30: qty 2

## 2023-06-30 MED ORDER — GADOBUTROL 1 MMOL/ML IV SOLN
10.0000 mL | Freq: Once | INTRAVENOUS | Status: AC | PRN
  Administered 2023-06-30: 10 mL via INTRAVENOUS

## 2023-06-30 MED ORDER — LACTATED RINGERS IV BOLUS (SEPSIS)
1000.0000 mL | Freq: Once | INTRAVENOUS | Status: AC
  Administered 2023-06-30: 1000 mL via INTRAVENOUS

## 2023-06-30 MED ORDER — IOHEXOL 350 MG/ML SOLN
75.0000 mL | Freq: Once | INTRAVENOUS | Status: AC | PRN
  Administered 2023-06-30: 75 mL via INTRAVENOUS

## 2023-06-30 NOTE — ED Provider Notes (Signed)
 [place] EMERGENCY DEPARTMENT AT [facility] Provider Note  History  Chief Complaint:  Weakness and Back Pain   Weakness Back Pain Associated symptoms: weakness      George Love is a 68 y.o. male with a history of bladder stone s/p cystoscopy on April 10 who presents the emergency department for back pain, fever and burning with urination.  He reports that he was diagnosed with a UTI on Friday.  States has been on doxycycline .  This morning woke up with severe lower back pain.  He reports no lateralizing to the right or left in the area of the kidneys.  He does report suprapubic abdominal pain.  Abdominal bloating is also noted.  He had fever over the past 2 days.  Past Medical History:  Diagnosis Date   Anxiety    Bladder stone    GERD (gastroesophageal reflux disease)    Hyperlipidemia     Past Surgical History:  Procedure Laterality Date   CYSTOSCOPY WITH LITHOLAPAXY N/A 05/30/2023   Procedure: CYSTOSCOPY, WITH BLADDER CALCULUS LITHOLAPAXY;  Surgeon: Marco Severs, MD;  Location: [facility];  Service: Urology;  Laterality: N/A;   ESOPHAGOGASTRODUODENOSCOPY (EGD) WITH PROPOFOL  N/A 08/14/2022   Procedure: ESOPHAGOGASTRODUODENOSCOPY (EGD) WITH PROPOFOL ;  Surgeon: Ozell Blunt, MD;  [facility];  Service: Gastroenterology;  Laterality: N/A;   ESOPHAGOGASTRODUODENOSCOPY (EGD) WITH PROPOFOL  N/A 04/09/2023   Procedure: ESOPHAGOGASTRODUODENOSCOPY (EGD) WITH PROPOFOL ;  Surgeon: Ozell Blunt, MD;  [facility];  Service: Gastroenterology;  Laterality: N/A;   HOLMIUM LASER APPLICATION N/A 05/30/2023   Procedure: HOLMIUM LASER APPLICATION;  Surgeon: Marco Severs, MD;  Location: [facility];  Service: Urology;  Laterality: N/A;   KNEE ARTHROSCOPY Right    POLYPECTOMY  08/14/2022   Procedure: POLYPECTOMY;  Surgeon: Ozell Blunt, MD;  [facility]: [facility];  Service: Gastroenterology;;   POLYPECTOMY  04/09/2023   Procedure: POLYPECTOMY;  Surgeon: Ozell Blunt, MD;  [facility]: [facility];  Service: Gastroenterology;;   PROSTATE SURGERY     x4    History reviewed. No pertinent family history.  Social History   Tobacco Use   Smoking status: Former    Current packs/day: 0.00    Average packs/day: 1 pack/day for 20.0 years (20.0 ttl pk-yrs)    Types: Cigarettes    Start date: 10/27/1993    Quit date: 10/27/2013    Years since quitting: 9.6   Smokeless tobacco: Never  Vaping Use   Vaping status: Never Used  Substance Use Topics   Alcohol use: Yes    Comment: socially. 3-4 spready out over a week.   Drug use: Never    Review of Systems  Review of Systems  Musculoskeletal:  Positive for back pain.  Neurological:  Positive for weakness.     Reviewed and documented in HPI if pertinent.   Physical Exam   ED Triage Vitals  Encounter Vitals Group     BP 06/30/23 1440 138/80     Systolic BP Percentile --      Diastolic BP Percentile --      Pulse Rate 06/30/23 1440 99     Resp 06/30/23 1440 18     Temp 06/30/23 1440 100.1 F (37.8 C)     Temp src --      SpO2 06/30/23 1440 96 %     Weight 06/30/23 1446 240 lb (108.9 kg)     Height 06/30/23 1446 6' (1.829 m)     Head Circumference --  Peak Flow --      Pain Score 06/30/23 1446 2     Pain Loc --      Pain Education --      Exclude from Growth Chart --      Physical Exam Vitals and nursing note reviewed.  Constitutional:      General: He is not in acute distress.    Appearance: He is well-developed.  HENT:     Head: Normocephalic and atraumatic.  Eyes:     Conjunctiva/sclera: Conjunctivae normal.  Cardiovascular:     Rate and Rhythm: Normal rate and regular rhythm.     Heart sounds: No murmur heard. Pulmonary:     Effort: Pulmonary effort is normal. No respiratory distress.     Breath sounds: Normal breath sounds.  Abdominal:     Palpations: Abdomen is soft.     Tenderness: There is abdominal tenderness in the suprapubic area.  Genitourinary:    Comments:  No erythema, purulent drainage, lesion seen on the penis Musculoskeletal:        General: No swelling.     Cervical back: Neck supple. No bony tenderness.     Thoracic back: No bony tenderness.     Lumbar back: No bony tenderness.     Right lower leg: No edema.     Left lower leg: No edema.  Skin:    General: Skin is warm and dry.     Capillary Refill: Capillary refill takes less than 2 seconds.  Neurological:     Mental Status: He is alert.  Psychiatric:        Mood and Affect: Mood normal.      Procedures   Procedures  ED Course - Medical Decision Making  Brief Overview ERASTUS NICASIO is a 68 y.o. male who presents as per above.  I have reviewed the nursing documentation for past medical history, family history, and social history and agree.  I have reviewed the patient's vital signs. There are ***no abnormalities***.  Initial Differential Diagnoses: I am primarily concerned for ***  Therapies: These medications and interventions were provided for the patient while in the ED.  Medications - No data to display  Testing Results: On my interpretation labs are significant for : ***  On my interpretation imaging is significant for: ***  ***I interpreted the ECG. It reveals a sinus rhythm. The QTc, PR, and QRS are appropriate. There are no signs of acute ischemia or of significant electrical abnormalities. The ECG does not show a STEMI. There are no ST depressions. ***There are no T wave inversions. There is no evidence of a High-Grade Conduction Block, WPW, Brugada Sign, ARVC, DeWinters T Waves, or Wellens Waves.  See the EMR for full details regarding lab and imaging results.  ***  Medical Decision Making Amount and/or Complexity of Data Reviewed Labs: ordered. Radiology: ordered.     ### All radiography studies, electrocardiograms, and laboratory data were personally reviewed by me and incorporated into my medical decision making. Impression  No diagnosis  found.   Note: Chief Executive Officer was used in the creation of this note.

## 2023-06-30 NOTE — ED Notes (Signed)
 MD Aldean Amass and MDColbaugh notified and aware of pt temp of 100.6.

## 2023-06-30 NOTE — ED Triage Notes (Signed)
 Pt has lower back pain, fever, and cloudy urine. Pt diagnosed with UTI 2 days ago and is taking doxycycline . Pt had stone removed 3 weeks ago, a UTI 2 weeks ago which was treated w/bactrim  and was fine until 3 days ago started having symptoms of UTI again. Pt states that this am, pt had lower back pain and bilateral elbow pain that was sharp and he was unable to move d/t pain. Pt took muscle relaxer which helped but is concerned for infection.

## 2023-06-30 NOTE — ED Notes (Signed)
 Awaiting patient from lobby.

## 2023-06-30 NOTE — Sepsis Progress Note (Signed)
 Sepsis protocol is being followed by [facility].

## 2023-06-30 NOTE — ED Notes (Signed)
Phlebotomy to collect second set of blood cultures

## 2023-06-30 NOTE — ED Notes (Signed)
 Pt took tylenol  at home at 1000 this AM.

## 2023-06-30 NOTE — Consult Note (Signed)
 Initial Consultation Note   Patient: George Love ZOX:[id number] DOB: 12/17/1955 PCP: Sylvia Everts, PA-C DOA: 06/30/2023 DOS: the patient was seen and examined on 06/30/2023 Primary service: Jerilynn Montenegro, MD  Referring physician: Jerilynn Montenegro, MD  Reason for consult: Complicated UTI   Assessment/Plan: Assessment and Plan:  68 year old male with with a history of recurrent UTI and cystolitholopaxy of bladder stone on 4/10, additional history including BPH, GERD, anxiety; who presented due to new onset of back pain and persistent fevers after starting on antibiotic therapy for UTI.  His back pain does not localize to the costovertebral angles and is more so near his sacrum suspect it is musculoskeletal.  CT abdomen pelvis has no signs of pyelonephritis or other complicating factors.  While he does have persistent fever to Tmax 38.1 here he meets no other SIRS criteria. WBC 8. Urinalysis appears improved from 5/9 (now absent pyuria, rare bacteria); unfortunately no [facility] culture [facility] (reviewed results on his computer). I reviewed his prior micro data with E coli sp that was FQ intermediate. Feel that he would be suitable for discharge from the ED and continued oral antibiotic therapy for his UTI as outpatient.  He will be contacted if his blood cultures resulted as positive.   Complicated UTI - Change antibiotic therapy to Bactrim  DS 160-800 BID for 10 days  - Urine culture can be followed by outpatient provider.  Will be contacted if blood cultures results positive. - Return instructions per below  Back pain, suspect musculoskeletal - Can continue Tylenol  prn, heat, and reports that he has a prn muscle relaxant at home.  Disposition: - Okay for discharge from the ED with oral antibiotic therapy. Strict return instructions if worsening fever, increased heart rate, lower blood pressure, or other concerning symptoms.  He will be contacted if his blood cultures result is  positive.    TRH will sign off at present, please call us  again when needed.  =========================================  HPI: George Love is a 68 y.o. male with past medical history of recurrent UTI and cystolitholopaxy of bladder stone on 4/10, additional history including BPH, GERD, anxiety; who presented due to new onset of back pain and persistent fevers after starting on antibiotic therapy for UTI.  Reports that the pain near his sacrum describes a cramping and then also cramping in both arms.  Other than this he states that his urinary symptoms have actually been improving and the urine appears clear and he is not having dysuria.  He reports that he is still having persistent fevers, chills takes Tylenol  prn at home.  Tolerating p.o.'s and has no other complaints  Review of Systems: As mentioned in the history of present illness. All other systems reviewed and are negative. Past Medical History:  Diagnosis Date   Anxiety    Bladder stone    GERD (gastroesophageal reflux disease)    Hyperlipidemia    Past Surgical History:  Procedure Laterality Date   CYSTOSCOPY WITH LITHOLAPAXY N/A 05/30/2023   Procedure: CYSTOSCOPY, WITH BLADDER CALCULUS LITHOLAPAXY;  Surgeon: Marco Severs, MD;  Location: [facility];  Service: Urology;  Laterality: N/A;   ESOPHAGOGASTRODUODENOSCOPY (EGD) WITH PROPOFOL  N/A 08/14/2022   Procedure: ESOPHAGOGASTRODUODENOSCOPY (EGD) WITH PROPOFOL ;  Surgeon: Ozell Blunt, MD;  Location: [facility];  Service: Gastroenterology;  Laterality: N/A;   ESOPHAGOGASTRODUODENOSCOPY (EGD) WITH PROPOFOL  N/A 04/09/2023   Procedure: ESOPHAGOGASTRODUODENOSCOPY (EGD) WITH PROPOFOL ;  Surgeon: Ozell Blunt, MD;  Location: [facility];  Service: Gastroenterology;  Laterality: N/A;  HOLMIUM LASER APPLICATION N/A 05/30/2023   Procedure: HOLMIUM LASER APPLICATION;  Surgeon: Marco Severs, MD;  Location: [facility];  Service: Urology;  Laterality: N/A;   KNEE ARTHROSCOPY Right     POLYPECTOMY  08/14/2022   Procedure: POLYPECTOMY;  Surgeon: Ozell Blunt, MD;  Location: [facility];  Service: Gastroenterology;;   POLYPECTOMY  04/09/2023   Procedure: POLYPECTOMY;  Surgeon: Ozell Blunt, MD;  Location: [facility];  Service: Gastroenterology;;   PROSTATE SURGERY     x4   Social History:  reports that he quit smoking about 9 years ago. His smoking use included cigarettes. He started smoking about 29 years ago. He has a 20 pack-year smoking history. He has never used smokeless tobacco. He reports current alcohol use. He reports that he does not use drugs.  No Known Allergies  History reviewed. No pertinent family history.  Prior to Admission medications   Medication Sig Start Date End Date Taking? Authorizing Provider  Ascorbic Acid (VITAMIN C) 1000 MG tablet Take 1,000 mg by mouth daily.   Yes [provider]  cholecalciferol (VITAMIN D3) 25 MCG (1000 UNIT) tablet Take 1,000 Units by mouth daily.   Yes [provider]  doxycycline  (VIBRAMYCIN ) 100 MG capsule Take 1 capsule (100 mg total) by mouth every 12 (twelve) hours. 06/28/23  Yes McKenzie, Arden Beck, MD  escitalopram  (LEXAPRO ) 10 MG tablet Take 1 tablet (10 mg total) by mouth daily. 01/10/23  Yes Saguier, Gaylin Ke, PA-C  Menatetrenone (VITAMIN K2) 100 MCG TABS Take 100 mcg by mouth daily.   Yes [provider]  Multiple Vitamin (MULTIVITAMIN) tablet Take 1 tablet by mouth daily.   Yes [provider]  omeprazole (PRILOSEC OTC) 20 MG tablet Take 20 mg by mouth daily.   Yes [provider]  Red Yeast Rice 600 MG CAPS Take 1,200 mg by mouth daily.   Yes [provider]  zinc sulfate 220 (50 Zn) MG capsule Take 220 mg by mouth daily.   Yes [provider]  polyethylene glycol powder (GLYCOLAX /MIRALAX ) 17 GM/SCOOP powder Take 17 g by mouth daily. Patient not taking: Reported on 06/30/2023 06/02/23   [provider]    Physical Exam: Vitals:   06/30/23  1930 06/30/23 2000 06/30/23 2030 06/30/23 2100  BP: 128/65 134/62 114/71 120/66  Pulse: 94 83 82 80  Resp: (!) 22 17 17 12   Temp:      TempSrc:      SpO2: 100% 99% 98% 98%  Weight:      Height:        Gen: Awake, alert, NAD  CV: Regular, normal S1, S2, no murmurs  Resp: Normal WOB, CTAB  Abd: Flat, normoactive, nontender.  No CVAT MSK: No midline or paravertebral tenderness.  Symmetric, trace edema Skin: No rashes or lesions to exposed skin  Neuro: Alert and interactive  Psych: euthymic, appropriate    Data Reviewed:   WBC 8 UA rare bacteria, no pyuria, trace leukocytes. No nitrites  Ucx pending  Bcx pending   Family Communication: No  Primary team communication: Yes discussed with EDP  Thank you very much for involving us  in the care of your patient.  Author: Arnulfo Larch, MD 06/30/2023 9:31 PM  For on call review [email or web address].

## 2023-06-30 NOTE — ED Notes (Signed)
 Patient transported to MRI

## 2023-06-30 NOTE — Discharge Instructions (Addendum)
 George Love:  Thank you for allowing us  to take care of you today.  We hope you begin feeling better soon. You were seen today for lower back pain, fever. Appears that you have a partial treated UTI. We have prescribed bactrim  to your pharmacy.  To-Do:  Please follow-up with your primary doctor within the next 2-3 days. It is important that you review any labs or imaging results (if any) that you had today with them. Your preliminary imaging results (if any) are attached. Please return to the Emergency Department or call 911 if you experience chest pain, shortness of breath, severe pain, severe fever, altered mental status, or have any reason to think that you need emergency medical care.  Thank you again.  Hope you feel better soon.  Arminda Landmark, MD Department of Emergency Medicine

## 2023-07-01 ENCOUNTER — Encounter (HOSPITAL_COMMUNITY): Payer: Self-pay

## 2023-07-01 ENCOUNTER — Ambulatory Visit (HOSPITAL_COMMUNITY): Admission: RE | Admit: 2023-07-01 | Source: Ambulatory Visit

## 2023-07-01 DIAGNOSIS — N390 Urinary tract infection, site not specified: Secondary | ICD-10-CM | POA: Diagnosis not present

## 2023-07-01 LAB — URINE CULTURE

## 2023-07-01 MED ORDER — SULFAMETHOXAZOLE-TRIMETHOPRIM 800-160 MG PO TABS: 1.0000 | ORAL_TABLET | Freq: Two times a day (BID) | ORAL | 0 refills | Status: AC

## 2023-07-01 MED ORDER — IBUPROFEN 400 MG PO TABS
400.0000 mg | ORAL_TABLET | Freq: Once | ORAL | Status: AC
  Administered 2023-07-01: 400 mg via ORAL
  Filled 2023-07-01: qty 1

## 2023-07-01 NOTE — ED Provider Notes (Signed)
  Physical Exam  BP 136/76   Pulse 70   Temp (!) 101.2 F (38.4 C) (Oral)   Resp 16   Ht 6' (1.829 m)   Wt 108.9 kg   SpO2 95%   BMI 32.55 kg/m   Physical Exam  Procedures  Procedures  ED Course / MDM   Clinical Course as of 07/01/23 0114  Mon Jul 01, 2023  0021 67YOM, hx of bladder stone removal in May. Fevers at home, back pain, recent urology visit showed UA concerning for infection, started on doxy. Febrile here. Lower lumbar pain, sacral. Hospitalists refused admission. Pending MRI. If negative for any issue, discharge with bactrim  and follow up with urology.  [CG]    Clinical Course User Index [CG] Adel Aden, PA-C   Medical Decision Making Amount and/or Complexity of Data Reviewed Labs: ordered. Radiology: ordered.  Risk OTC drugs. Prescription drug management.   68 year old male signed out to me at shift change pending MRI.  Please see the previous provider note for further details.  In short, 68 year old male presents for evaluation.  Fevers at home, back pain, recent urology visit with concerning UA for infection.  Patient started to doxycycline .  Patient arrives here complaining of low lumbar spinal pain, he is febrile.  Hospitalist was paged for admission however hospitalist refused admission.  Decision was made to MRI patient's spine to rule out any kind of infectious etiology of his low back pain.  Per previous provider, if MRI negative, patient to be discharged on Bactrim .  Will follow-up with urology.  MRI shows no concerning signs for infection.  Patient will be discharged with Bactrim , will follow-up with urology.  He was given return precautions and he voiced understanding.  Stable to discharge home.       Adel Aden, PA-C 07/01/23 0114    Long, Joshua G, MD 07/01/23 [phone]

## 2023-07-02 ENCOUNTER — Encounter: Payer: Self-pay | Admitting: Medical

## 2023-07-02 LAB — URINE CULTURE: Culture: NO GROWTH

## 2023-07-03 ENCOUNTER — Encounter: Admitting: Urology

## 2023-07-04 ENCOUNTER — Encounter: Payer: Self-pay | Admitting: Urology

## 2023-07-04 LAB — BLOOD CULTURE ID PANEL (REFLEXED) - BCID2

## 2023-07-05 LAB — CULTURE, BLOOD (ROUTINE X 2)
Culture: NO GROWTH
Special Requests: ADEQUATE

## 2023-07-06 LAB — CULTURE, BLOOD (ROUTINE X 2): Special Requests: ADEQUATE

## 2023-07-07 ENCOUNTER — Telehealth (HOSPITAL_BASED_OUTPATIENT_CLINIC_OR_DEPARTMENT_OTHER): Payer: Self-pay | Admitting: *Deleted

## 2023-07-07 ENCOUNTER — Emergency Department (HOSPITAL_COMMUNITY)
Admission: EM | Admit: 2023-07-07 | Discharge: 2023-07-07 | Disposition: A | Discharge status: Home / Self Care | Attending: Emergency Medicine | Admitting: Emergency Medicine

## 2023-07-07 ENCOUNTER — Other Ambulatory Visit: Payer: Self-pay

## 2023-07-07 ENCOUNTER — Encounter (HOSPITAL_COMMUNITY): Payer: Self-pay

## 2023-07-07 DIAGNOSIS — R7881 Bacteremia: Secondary | ICD-10-CM | POA: Insufficient documentation

## 2023-07-07 DIAGNOSIS — R7989 Other specified abnormal findings of blood chemistry: Secondary | ICD-10-CM | POA: Diagnosis present

## 2023-07-07 LAB — URINALYSIS, ROUTINE W REFLEX MICROSCOPIC
Bilirubin Urine: NEGATIVE
Glucose, UA: NEGATIVE mg/dL
Hgb urine dipstick: NEGATIVE
Ketones, ur: NEGATIVE mg/dL
Leukocytes,Ua: NEGATIVE
Nitrite: NEGATIVE
Protein, ur: NEGATIVE mg/dL
Specific Gravity, Urine: 1.024 (ref 1.005–1.030)
pH: 5 (ref 5.0–8.0)

## 2023-07-07 LAB — CBC WITH DIFFERENTIAL/PLATELET
Abs Immature Granulocytes: 0.21 10*3/uL — ABNORMAL HIGH (ref 0.00–0.07)
Basophils Absolute: 0.1 10*3/uL (ref 0.0–0.1)
Basophils Relative: 1 %
Eosinophils Absolute: 0.2 10*3/uL (ref 0.0–0.5)
Eosinophils Relative: 2 %
HCT: 37.2 % — ABNORMAL LOW (ref 39.0–52.0)
Hemoglobin: 12.3 g/dL — ABNORMAL LOW (ref 13.0–17.0)
Immature Granulocytes: 2 %
Lymphocytes Relative: 17 %
Lymphs Abs: 1.6 10*3/uL (ref 0.7–4.0)
MCH: 29.8 pg (ref 26.0–34.0)
MCHC: 33.1 g/dL (ref 30.0–36.0)
MCV: 90.1 fL (ref 80.0–100.0)
Monocytes Absolute: 0.6 10*3/uL (ref 0.1–1.0)
Monocytes Relative: 7 %
Neutro Abs: 6.5 10*3/uL (ref 1.7–7.7)
Neutrophils Relative %: 71 %
Platelets: 344 10*3/uL (ref 150–400)
RBC: 4.13 MIL/uL — ABNORMAL LOW (ref 4.22–5.81)
RDW: 12.4 % (ref 11.5–15.5)
WBC: 9.1 10*3/uL (ref 4.0–10.5)
nRBC: 0 % (ref 0.0–0.2)

## 2023-07-07 LAB — BASIC METABOLIC PANEL WITH GFR
Anion gap: 10 (ref 5–15)
BUN: 21 mg/dL (ref 8–23)
CO2: 21 mmol/L — ABNORMAL LOW (ref 22–32)
Calcium: 9.6 mg/dL (ref 8.9–10.3)
Chloride: 107 mmol/L (ref 98–111)
Creatinine, Ser: 1.36 mg/dL — ABNORMAL HIGH (ref 0.61–1.24)
GFR, Estimated: 57 mL/min — ABNORMAL LOW (ref >60)
Glucose, Bld: 97 mg/dL (ref 70–99)
Potassium: 4.4 mmol/L (ref 3.5–5.1)
Sodium: 138 mmol/L (ref 135–145)

## 2023-07-07 MED ORDER — CEFPODOXIME PROXETIL 200 MG PO TABS: 400.0000 mg | ORAL_TABLET | Freq: Two times a day (BID) | ORAL | 0 refills | Status: AC

## 2023-07-07 NOTE — Progress Notes (Signed)
 ED Antimicrobial Stewardship Positive Culture Follow Up   George Love is an 68 y.o. male who presented to [facility] with a chief complaint of  Chief Complaint  Patient presents with   Weakness   Back Pain    Recent Results (from the past 720 hours)  Urine Culture     Status: Abnormal   Collection Time: 06/28/23  8:15 AM   Specimen: Urine   UC  Result Value Ref Range Status   Urine Culture, Routine Final report (A)  Final   Organism ID, Bacteria Escherichia coli (A)  Final    Comment: Multi-Drug Resistant Organism Greater than 100,000 colony forming units per mL    Antimicrobial Susceptibility Comment  Final    Comment:       ** S = Susceptible; I = Intermediate; R = Resistant **                    P = Positive; N = Negative             MICS are expressed in micrograms per mL    Antibiotic                 RSLT#1    RSLT#2    RSLT#3    RSLT#4 Amoxicillin /Clavulanic Acid    R Ampicillin                     R Cefazolin                       R Cefepime                        S Cefoxitin                      R Cefpodoxime                    R Ceftriaxone                     S Ciprofloxacin                  S Ertapenem                      S Gentamicin                     S Levofloxacin                   I Meropenem                      S Nitrofurantoin                 S Piperacillin/Tazobactam        S Tetracycline                   S Tobramycin                     S Trimethoprim /Sulfa              S   Microscopic Examination     Status: Abnormal   Collection Time: 06/28/23  8:16 AM  Result Value Ref Range Status   WBC, UA >30 (A) 0 - 5 /hpf Final   RBC, Urine 0-2 0 - 2 /hpf Final   Epithelial  Cells (non renal) 0-10 0 - 10 /hpf Final   Casts None seen None seen /lpf Final   Bacteria, UA Many (A) None seen/Few Final   Yeast, UA Present (A) None seen Final  Culture, Urine (Do not remove urinary catheter, catheter placed by urology or difficult to place)     Status: None    Collection Time: 06/30/23  2:48 PM   Specimen: Urine, Clean Catch  Result Value Ref Range Status   Specimen Description URINE, CLEAN CATCH  Final   Special Requests NONE  Final   Culture   Final    NO GROWTH Performed at [facility], [address], [place], [place] [address]    Report Status 07/02/2023 FINAL  Final  Resp panel by RT-PCR (RSV, Flu A&B, Covid) Anterior Nasal Swab     Status: None   Collection Time: 06/30/23  3:23 PM   Specimen: Anterior Nasal Swab  Result Value Ref Range Status   SARS Coronavirus 2 by RT PCR NEGATIVE NEGATIVE Final   Influenza A by PCR NEGATIVE NEGATIVE Final   Influenza B by PCR NEGATIVE NEGATIVE Final    Comment: (NOTE) The Xpert Xpress SARS-CoV-2/FLU/RSV plus assay is intended as an aid in the diagnosis of influenza from Nasopharyngeal swab specimens and should not be used as a sole basis for treatment. Nasal washings and aspirates are unacceptable for Xpert Xpress SARS-CoV-2/FLU/RSV testing.  Fact Sheet for Patients: BloggerCourse.com  Fact Sheet for Healthcare Providers: SeriousBroker.it  This test is not yet approved or cleared by the [place]  FDA and has been authorized for detection and/or diagnosis of SARS-CoV-2 by FDA under an Emergency Use Authorization (EUA). This EUA will remain in effect (meaning this test can be used) for the duration of the COVID-19 declaration under Section 564(b)(1) of the Act, 21 U.S.C. section 360bbb-3(b)(1), unless the authorization is terminated or revoked.     Resp Syncytial Virus by PCR NEGATIVE NEGATIVE Final    Comment: (NOTE) Fact Sheet for Patients: BloggerCourse.com  Fact Sheet for Healthcare Providers: SeriousBroker.it  This test is not yet approved or cleared by the [place]  FDA and has been authorized for detection and/or diagnosis of SARS-CoV-2 by FDA under an Emergency  Use Authorization (EUA). This EUA will remain in effect (meaning this test can be used) for the duration of the COVID-19 declaration under Section 564(b)(1) of the Act, 21 U.S.C. section 360bbb-3(b)(1), unless the authorization is terminated or revoked.  Performed at [facility], [address], [place], [place] [address]   Blood Culture (routine x 2)     Status: None   Collection Time: 06/30/23  3:23 PM   Specimen: BLOOD  Result Value Ref Range Status   Specimen Description BLOOD LEFT ANTECUBITAL  Final   Special Requests   Final    BOTTLES DRAWN AEROBIC AND ANAEROBIC Blood Culture adequate volume   Culture   Final    NO GROWTH 5 DAYS Performed at [facility], [address], [place], [place] [address]    Report Status 07/05/2023 FINAL  Final  Blood Culture (routine x 2)     Status: Abnormal   Collection Time: 06/30/23  3:28 PM   Specimen: BLOOD RIGHT ARM  Result Value Ref Range Status   Specimen Description BLOOD RIGHT ARM  Final   Special Requests   Final    BOTTLES DRAWN AEROBIC AND ANAEROBIC Blood Culture adequate volume   Culture  Setup Time   Final  GRAM NEGATIVE RODS AEROBIC BOTTLE ONLY CRITICAL RESULT CALLED TO, READ BACK BY AND VERIFIED WITH: J GLOSTER CHARGED NURSE 07/04/2023 @ 0510 BY AB Performed at [facility], [address] [place], [place] [address]    Culture ESCHERICHIA COLI (A)  Final   Report Status 07/06/2023 FINAL  Final   Organism ID, Bacteria ESCHERICHIA COLI  Final   Organism ID, Bacteria ESCHERICHIA COLI  Final      Susceptibility   Escherichia coli - KIRBY BAUER*    CEFAZOLIN  RESISTANT Resistant    Escherichia coli - MIC*    AMPICILLIN >=32 RESISTANT Resistant     CEFEPIME  <=0.12 SENSITIVE Sensitive     CEFTAZIDIME 4 SENSITIVE Sensitive     CEFTRIAXONE  0.5 SENSITIVE Sensitive     CIPROFLOXACIN 0.5 INTERMEDIATE Intermediate     GENTAMICIN <=1 SENSITIVE Sensitive     IMIPENEM <=0.25 SENSITIVE Sensitive      TRIMETH /SULFA  >=320 RESISTANT Resistant     AMPICILLIN/SULBACTAM 16 INTERMEDIATE Intermediate     PIP/TAZO <=4 SENSITIVE Sensitive ug/mL    * ESCHERICHIA COLI    ESCHERICHIA COLI  Blood Culture ID Panel (Reflexed)     Status: Abnormal   Collection Time: 06/30/23  3:28 PM  Result Value Ref Range Status   Enterococcus faecalis NOT DETECTED NOT DETECTED Final   Enterococcus Faecium NOT DETECTED NOT DETECTED Final   Listeria monocytogenes NOT DETECTED NOT DETECTED Final   Staphylococcus species NOT DETECTED NOT DETECTED Final   Staphylococcus aureus (BCID) NOT DETECTED NOT DETECTED Final   Staphylococcus epidermidis NOT DETECTED NOT DETECTED Final   Staphylococcus lugdunensis NOT DETECTED NOT DETECTED Final   Streptococcus species NOT DETECTED NOT DETECTED Final   Streptococcus agalactiae NOT DETECTED NOT DETECTED Final   Streptococcus pneumoniae NOT DETECTED NOT DETECTED Final   Streptococcus pyogenes NOT DETECTED NOT DETECTED Final   A.calcoaceticus-baumannii NOT DETECTED NOT DETECTED Final   Bacteroides fragilis NOT DETECTED NOT DETECTED Final   Enterobacterales DETECTED (A) NOT DETECTED Final    Comment: Enterobacterales represent a large order of gram negative bacteria, not a single organism. CRITICAL RESULT CALLED TO, READ BACK BY AND VERIFIED WITH: J GLOSTER CHARGED NURSE 07/04/2023 @ 0510 BY AB    Enterobacter cloacae complex NOT DETECTED NOT DETECTED Final   Escherichia coli DETECTED (A) NOT DETECTED Final    Comment: CRITICAL RESULT CALLED TO, READ BACK BY AND VERIFIED WITH: J GLOSTER CHARGED NURSE 07/04/2023 @ 0510 BY AB    Klebsiella aerogenes NOT DETECTED NOT DETECTED Final   Klebsiella oxytoca NOT DETECTED NOT DETECTED Final   Klebsiella pneumoniae NOT DETECTED NOT DETECTED Final   Proteus species NOT DETECTED NOT DETECTED Final   Salmonella species NOT DETECTED NOT DETECTED Final   Serratia marcescens NOT DETECTED NOT DETECTED Final   Haemophilus influenzae NOT DETECTED  NOT DETECTED Final   Neisseria meningitidis NOT DETECTED NOT DETECTED Final   Pseudomonas aeruginosa NOT DETECTED NOT DETECTED Final   Stenotrophomonas maltophilia NOT DETECTED NOT DETECTED Final   Candida albicans NOT DETECTED NOT DETECTED Final   Candida auris NOT DETECTED NOT DETECTED Final   Candida glabrata NOT DETECTED NOT DETECTED Final   Candida krusei NOT DETECTED NOT DETECTED Final   Candida parapsilosis NOT DETECTED NOT DETECTED Final   Candida tropicalis NOT DETECTED NOT DETECTED Final   Cryptococcus neoformans/gattii NOT DETECTED NOT DETECTED Final   CTX-M ESBL NOT DETECTED NOT DETECTED Final   Carbapenem resistance IMP NOT DETECTED NOT DETECTED Final   Carbapenem resistance KPC  NOT DETECTED NOT DETECTED Final   Carbapenem resistance NDM NOT DETECTED NOT DETECTED Final   Carbapenem resist OXA 48 LIKE NOT DETECTED NOT DETECTED Final   Carbapenem resistance VIM NOT DETECTED NOT DETECTED Final    Comment: Performed at [facility], [address], [place], [place] [address]    Patient discharged on Bactrim , found to have 1/4 BCX with E.Coli. All 1st line oral agents with high bioavailability - bactrim , FQ, 1st generation cephalosporins, and amoxil /augmentin  are resistant. Leaving only second line agents with less bioavailability I.e- cefpodoxime and cefdinir. Patient with no lead in IV therapy. Consulted ID, Dr. Kathyanne Parkers, recommending to come in for [facility], CBC, UA, and Ucx. If patient refuses then recommends to keep planned urology follow up for repeat Bcx. Did not recommend any changes to current antimicrobial therapy.   ED Provider: Valentina Gasman, PA-C   Mamie Searles, PharmD, BCCCP  07/07/2023, 1:18 PM Clinical Pharmacist Monday - Friday phone -  [phone] Saturday - Sunday phone - [phone]

## 2023-07-07 NOTE — Telephone Encounter (Signed)
 Post ED Visit - Positive Culture Follow-up: Successful Patient Follow-Up  Culture assessed and recommendations reviewed by:  [x]  Mamie Searles, Pharm.D. []  Skeet Duke, Pharm.D., BCPS AQ-ID []  Leslee Rase, Pharm.D., BCPS []  Garland Junk, Pharm.D., BCPS []  [place], [address].D., BCPS, AAHIVP []  Alcide Aly, Pharm.D., BCPS, AAHIVP []  Jerri Morale, PharmD, BCPS []  Graham Laws, PharmD, BCPS []  Cleda Curly, PharmD, BCPS []  Tamar Fairly, PharmD  Positive blood culture  []  Patient discharged without antimicrobial prescription and treatment is now indicated [x]  Organism is resistant to prescribed ED discharge antimicrobial []  Patient with positive blood cultures  Changes discussed with ED provider: Valentina Gasman, PA  Spoke to pt. Recommended pt come to ER for [facility] and other labs indicated. Pt understand and is on the way back from [place], [place]. Pt stated he will return to the ED once he gets back to [place].    Contacted patient, date 07/07/23, time [address]   Georgine Kitchens 07/07/2023, 2:28 PM

## 2023-07-07 NOTE — ED Provider Notes (Signed)
 Discussed patient with pharmacy.  Blood culture drawn on 5/11 resulted yesterday and grew E. coli.  Patient currently on Doxy and Bactrim .  Pharmacy was concerned that antibiotic coverage not appropriate given resistance information from the culture.  Discussed patient with Dr. Francee Inch of infectious disease.  She recommended that he come back to the hospital given the results for blood cultures x 2, CBC urinalysis and urine culture.  She stated that if he was still having symptoms of bacteremia or UTI and/or a high white blood cell count that he would likely need to come in for IV antibiotics.  I discussed this plan with his wife.  He is currently in [place]  visiting family.  She states she was going to make contact with him and encouraged him to come in.  Dr. Francee Inch also stated that it would be reasonable for him to be seen tomorrow by his PCP or urologist but would certainly need the stated labs drawn at that time.  Overall recommendation is for him to return to the ED or urgent care for labs to be drawn and reassess.   Janalee Mcmurray, PA-C 07/07/23 1328    Almond Army, MD 07/10/23 [phone]

## 2023-07-07 NOTE — ED Provider Notes (Signed)
 [place] EMERGENCY DEPARTMENT AT [facility] Provider Note   CSN: [id number] Arrival date & time: 07/07/23  1547     History  Chief Complaint  Patient presents with   Abnormal Labs    George Love is a 68 y.o. male.  HPI   This is a 68 year old male, presents with a complaint of recently being tested for a urinary tract infection, the infection came back as likely E. coli and the patient was treated with doxycycline , 2 days later because of back pain and ongoing fevers he had Bactrim  added, blood cultures ended up coming back positive for E. coli that was resistant to Bactrim .  About a week ago is when he was last seen and has completed all of these medications.  He denies having fevers chills nausea vomiting or diarrhea, there is no back pain belly pain or urinary symptoms, he was called to come in for reevaluation because of the positive blood cultures.  The culture sensitivity report showed that Bactrim  resistant E. coli.  He also had Enterobacter in his blood.  He does state that he has had some unusual sweating a couple of times like he was breaking a fever but has not measured a fever and has not felt poorly otherwise.  Appetite has been normal, no other complaints today.  Home Medications Prior to Admission medications   Medication Sig Start Date End Date Taking? Authorizing Provider  cefpodoxime (VANTIN) 200 MG tablet Take 2 tablets (400 mg total) by mouth 2 (two) times daily for 7 days. 07/07/23 07/14/23 Yes Early Glisson, MD  Ascorbic Acid (VITAMIN C) 1000 MG tablet Take 1,000 mg by mouth daily.    [provider]  cholecalciferol (VITAMIN D3) 25 MCG (1000 UNIT) tablet Take 1,000 Units by mouth daily.    [provider]  doxycycline  (VIBRAMYCIN ) 100 MG capsule Take 1 capsule (100 mg total) by mouth every 12 (twelve) hours. 06/28/23   McKenzie, Arden Beck, MD  escitalopram  (LEXAPRO ) 10 MG tablet Take 1 tablet (10 mg total) by mouth daily. 01/10/23    Saguier, Gaylin Ke, PA-C  Menatetrenone (VITAMIN K2) 100 MCG TABS Take 100 mcg by mouth daily.    [provider]  Multiple Vitamin (MULTIVITAMIN) tablet Take 1 tablet by mouth daily.    [provider]  omeprazole (PRILOSEC OTC) 20 MG tablet Take 20 mg by mouth daily.    [provider]  polyethylene glycol powder (GLYCOLAX /MIRALAX ) 17 GM/SCOOP powder Take 17 g by mouth daily. Patient not taking: Reported on 06/30/2023 06/02/23   [provider]  Red Yeast Rice 600 MG CAPS Take 1,200 mg by mouth daily.    [provider]  sulfamethoxazole -trimethoprim  (BACTRIM  DS) 800-160 MG tablet Take 1 tablet by mouth 2 (two) times daily for 7 days. 07/01/23 07/08/23  Arminda Landmark, MD  zinc sulfate 220 (50 Zn) MG capsule Take 220 mg by mouth daily.    [provider]      Allergies    Patient has no known allergies.    Review of Systems   Review of Systems  All other systems reviewed and are negative.   Physical Exam Updated Vital Signs BP 127/83 (BP Location: Right Arm)   Pulse 76   Temp 98.5 F (36.9 C)   Resp 18   Ht 1.829 m (6')   Wt 108.9 kg   SpO2 95%   BMI 32.55 kg/m  Physical Exam Vitals and nursing note reviewed.  Constitutional:  General: He is not in acute distress.    Appearance: He is well-developed.  HENT:     Head: Normocephalic and atraumatic.     Mouth/Throat:     Pharynx: No oropharyngeal exudate.  Eyes:     General: No scleral icterus.       Right eye: No discharge.        Left eye: No discharge.     Conjunctiva/sclera: Conjunctivae normal.     Pupils: Pupils are equal, round, and reactive to light.  Neck:     Thyroid: No thyromegaly.     Vascular: No JVD.  Cardiovascular:     Rate and Rhythm: Normal rate and regular rhythm.     Heart sounds: Normal heart sounds. No murmur heard.    No friction rub. No gallop.  Pulmonary:     Effort: Pulmonary effort is normal. No respiratory distress.     Breath  sounds: Normal breath sounds. No wheezing or rales.  Abdominal:     General: Bowel sounds are normal. There is no distension.     Palpations: Abdomen is soft. There is no mass.     Tenderness: There is no abdominal tenderness.  Musculoskeletal:        General: No tenderness. Normal range of motion.     Cervical back: Normal range of motion and neck supple.     Right lower leg: No edema.     Left lower leg: No edema.  Lymphadenopathy:     Cervical: No cervical adenopathy.  Skin:    General: Skin is warm and dry.     Findings: No erythema or rash.  Neurological:     Mental Status: He is alert.     Coordination: Coordination normal.  Psychiatric:        Behavior: Behavior normal.     ED Results / Procedures / Treatments   Labs (all labs ordered are listed, but only abnormal results are displayed) Labs Reviewed  CBC WITH DIFFERENTIAL/PLATELET - Abnormal; Notable for the following components:      Result Value   RBC 4.13 (*)    Hemoglobin 12.3 (*)    HCT 37.2 (*)    Abs Immature Granulocytes 0.21 (*)    All other components within normal limits  BASIC METABOLIC PANEL WITH GFR - Abnormal; Notable for the following components:   CO2 21 (*)    Creatinine, Ser 1.36 (*)    GFR, Estimated 57 (*)    All other components within normal limits  URINALYSIS, ROUTINE W REFLEX MICROSCOPIC - Abnormal; Notable for the following components:   APPearance HAZY (*)    All other components within normal limits  CULTURE, BLOOD (ROUTINE X 2)  CULTURE, BLOOD (ROUTINE X 2)  URINE CULTURE    EKG None  Radiology No results found.  Procedures Procedures    Medications Ordered in ED Medications - No data to display  ED Course/ Medical Decision Making/ A&P                                 Medical Decision Making Amount and/or Complexity of Data Reviewed Labs: ordered.  Risk Prescription drug management.    This patient presents to the ED for concern of recheck after positive  blood culture differential diagnosis includes positive blood culture, resistant bacteria    Additional history obtained:  Additional history obtained from spouse and EMR External records from outside source obtained and reviewed  including prior results / culture and sensitivity   Lab Tests:  I Ordered, and personally interpreted labs.  The pertinent results include:  CBC and BMP without acute findings - Cr up slightly but GFR essentially unchanged.      Medicines ordered and prescription drug management:  I ordered medication including cefpodoxime as a prescription for appropriate coverage based on urinary sensitivity Reevaluation of the patient after these medicines showed that the patient stable I have reviewed the patients home medicines and have made adjustments as needed   Problem List / ED Course:  Discussed with pharmacy - they recommend change to cefpodoxime   Social Determinants of Health:  None  I discussed the patient's care with him and his spouse at length, they are both comfortable with discharge, I think he is good to fly to [place] this week, his vitals are normal, labs are unremarkable, urine is sterile.  Patient agreeable to discharge and can return if symptoms worsen, no signs of sepsis           Final Clinical Impression(s) / ED Diagnoses Final diagnoses:  Positive blood culture    Rx / DC Orders ED Discharge Orders          Ordered    cefpodoxime (VANTIN) 200 MG tablet  2 times daily        07/07/23 1845              Early Glisson, MD 07/07/23 1930

## 2023-07-07 NOTE — ED Provider Triage Note (Signed)
 Emergency Medicine Provider Triage Evaluation Note  George Love , a 68 y.o. male  was evaluated in triage.  Pt was called and told to come to the emergency department for evaluation due to a positive blood culture.  Patient was blood cultures grew out E. coli.  Review of Systems  Positive:  Negative: No fever or chills  Physical Exam  BP 127/83 (BP Location: Right Arm)   Pulse 76   Temp 98.5 F (36.9 C)   Resp 18   Ht 6' (1.829 m)   Wt 108.9 kg   SpO2 95%   BMI 32.55 kg/m  Gen:   Awake, no distress   Resp:  Normal effort  MSK:   Moves extremities without difficulty  Other:    Medical Decision Making  Medically screening exam initiated at 4:48 PM.  Appropriate orders placed.  LOFTON LEON was informed that the remainder of the evaluation will be completed by another provider, this initial triage assessment does not replace that evaluation, and the importance of remaining in the ED until their evaluation is complete.     Sandi Crosby, PA-C 07/07/23 1651

## 2023-07-07 NOTE — Discharge Instructions (Signed)
 Please change the antibiotic to cefpodoxime 400 mg twice a day for 7 days.  This will cover the infections that you had, thankfully your urine sample is totally sterile there does not appear to be any signs of infection and your blood counts are normal.  Return to the ER immediately for any severe or worsening symptoms including weakness, increasing sweating, nausea, vomiting, dysuria, back pain, fevers or anything else that is concerning to you.  Otherwise see your doctor this week for recheck prior to traveling to [place]

## 2023-07-07 NOTE — ED Triage Notes (Signed)
 Pt came in via POV d/t his PA telling him that he has Ecoli in his blood & he needs to come in for more blood tests & they are concerned that his ABT he is on now will not cover what he has going on. Pt reports that he was advised that he needs blood cultures, UA, plus recheck his WBC & CBC. A/Ox4, denies pain.

## 2023-07-08 LAB — URINE CULTURE: Culture: NO GROWTH

## 2023-07-12 LAB — CULTURE, BLOOD (ROUTINE X 2)
Culture: NO GROWTH
Culture: NO GROWTH

## 2023-07-19 ENCOUNTER — Other Ambulatory Visit (HOSPITAL_COMMUNITY)

## 2023-07-29 ENCOUNTER — Ambulatory Visit: Admitting: Urology

## 2023-07-29 ENCOUNTER — Ambulatory Visit (HOSPITAL_COMMUNITY)
Admission: RE | Admit: 2023-07-29 | Discharge: 2023-07-29 | Disposition: A | Discharge status: Home / Self Care | Source: Ambulatory Visit | Attending: Urology | Admitting: Urology

## 2023-07-29 DIAGNOSIS — N200 Calculus of kidney: Secondary | ICD-10-CM

## 2023-08-12 ENCOUNTER — Encounter: Payer: Self-pay | Admitting: Urology

## 2023-08-12 ENCOUNTER — Ambulatory Visit: Admitting: Urology

## 2023-08-12 VITALS — BP 139/69 | HR 74

## 2023-08-12 DIAGNOSIS — Z87442 Personal history of urinary calculi: Secondary | ICD-10-CM

## 2023-08-12 DIAGNOSIS — N401 Enlarged prostate with lower urinary tract symptoms: Secondary | ICD-10-CM | POA: Diagnosis not present

## 2023-08-12 DIAGNOSIS — R3915 Urgency of urination: Secondary | ICD-10-CM

## 2023-08-12 DIAGNOSIS — R351 Nocturia: Secondary | ICD-10-CM

## 2023-08-12 DIAGNOSIS — N210 Calculus in bladder: Secondary | ICD-10-CM

## 2023-08-12 LAB — URINALYSIS, ROUTINE W REFLEX MICROSCOPIC
Bilirubin, UA: NEGATIVE
Glucose, UA: NEGATIVE
Ketones, UA: NEGATIVE
Leukocytes,UA: NEGATIVE
Nitrite, UA: NEGATIVE
Protein,UA: NEGATIVE
RBC, UA: NEGATIVE
Specific Gravity, UA: 1.025 (ref 1.005–1.030)
Urobilinogen, Ur: 0.2 mg/dL (ref 0.2–1.0)
pH, UA: 6 (ref 5.0–7.5)

## 2023-08-12 MED ORDER — ALFUZOSIN HCL ER 10 MG PO TB24: 10.0000 mg | ORAL_TABLET | Freq: Every day | ORAL | 11 refills | Status: DC

## 2023-08-12 NOTE — Progress Notes (Signed)
 08/12/2023 11:53 AM   George Love May 19, 1955 [id number]  Referring provider: Dorina Loving, PA-C [id number] [place],  [place] [address]  Followup bladder calculus   HPI: George Love is a 34bn here for followup after bladder calculus removal. Renal US  07/29/23 shows no calculi and no hydronephrosis, IPSS 22 QOL 5 on no BPH therapy. He has bothersome urinary urgency and frequency. He uses 2-3 pads per day. Nocturia 4x. Stream is fair.    PMH: Past Medical History:  Diagnosis Date   Anxiety    Bladder stone    GERD (gastroesophageal reflux disease)    Hyperlipidemia     Surgical History: Past Surgical History:  Procedure Laterality Date   CYSTOSCOPY WITH LITHOLAPAXY N/A 05/30/2023   Procedure: CYSTOSCOPY, WITH BLADDER CALCULUS LITHOLAPAXY;  Surgeon: Sherrilee Belvie CROME, MD;  Location: [facility];  Service: Urology;  Laterality: N/A;   ESOPHAGOGASTRODUODENOSCOPY (EGD) WITH PROPOFOL  N/A 08/14/2022   Procedure: ESOPHAGOGASTRODUODENOSCOPY (EGD) WITH PROPOFOL ;  Surgeon: Rosalie Kitchens, MD;  Location: [facility];  Service: Gastroenterology;  Laterality: N/A;   ESOPHAGOGASTRODUODENOSCOPY (EGD) WITH PROPOFOL  N/A 04/09/2023   Procedure: ESOPHAGOGASTRODUODENOSCOPY (EGD) WITH PROPOFOL ;  Surgeon: Rosalie Kitchens, MD;  Location: [facility];  Service: Gastroenterology;  Laterality: N/A;   HOLMIUM LASER APPLICATION N/A 05/30/2023   Procedure: HOLMIUM LASER APPLICATION;  Surgeon: Sherrilee Belvie CROME, MD;  Location: [facility];  Service: Urology;  Laterality: N/A;   KNEE ARTHROSCOPY Right    POLYPECTOMY  08/14/2022   Procedure: POLYPECTOMY;  Surgeon: Rosalie Kitchens, MD;  Location: [facility];  Service: Gastroenterology;;   POLYPECTOMY  04/09/2023   Procedure: POLYPECTOMY;  Surgeon: Rosalie Kitchens, MD;  Location: [facility];  Service: Gastroenterology;;   PROSTATE SURGERY     x4    Home Medications:  Allergies as of 08/12/2023   No Known Allergies      Medication List         Accurate as of August 12, 2023 11:53 AM. If you have any questions, ask your nurse or doctor.          STOP taking these medications    doxycycline  100 MG capsule Commonly known as: VIBRAMYCIN    polyethylene glycol powder 17 GM/SCOOP powder Commonly known as: GLYCOLAX /MIRALAX        TAKE these medications    cholecalciferol 25 MCG (1000 UNIT) tablet Commonly known as: VITAMIN D3 Take 1,000 Units by mouth daily.   escitalopram  10 MG tablet Commonly known as: Lexapro  Take 1 tablet (10 mg total) by mouth daily.   multivitamin tablet Take 1 tablet by mouth daily.   omeprazole 20 MG tablet Commonly known as: PRILOSEC OTC Take 20 mg by mouth daily.   Red Yeast Rice 600 MG Caps Take 1,200 mg by mouth daily.   vitamin C 1000 MG tablet Take 1,000 mg by mouth daily.   Vitamin K2 100 MCG Tabs Take 100 mcg by mouth daily.   zinc sulfate (50mg  elemental zinc) 220 (50 Zn) MG capsule Take 220 mg by mouth daily.        Allergies: No Known Allergies  Family History: No family history on file.  Social History:  reports that he quit smoking about 9 years ago. His smoking use included cigarettes. He started smoking about 29 years ago. He has a 20 pack-year smoking history. He has never used smokeless tobacco. He reports current alcohol use. He reports that he does not use drugs.  ROS: All other review of systems were reviewed and are negative  except what is noted above in HPI  Physical Exam: BP 139/69   Pulse 74   Constitutional:  Alert and oriented, No acute distress. HEENT: [place] AT, moist mucus membranes.  Trachea midline, no masses. Cardiovascular: No clubbing, cyanosis, or edema. Respiratory: Normal respiratory effort, no increased work of breathing. GI: Abdomen is soft, nontender, nondistended, no abdominal masses GU: No CVA tenderness.  Lymph: No cervical or inguinal lymphadenopathy. Skin: No rashes, bruises or suspicious lesions. Neurologic: Grossly intact, no  focal deficits, moving all 4 extremities. Psychiatric: Normal mood and affect.  Laboratory Data: Lab Results  Component Value Date   WBC 9.1 07/07/2023   HGB 12.3 (L) 07/07/2023   HCT 37.2 (L) 07/07/2023   MCV 90.1 07/07/2023   PLT 344 07/07/2023    Lab Results  Component Value Date   CREATININE 1.36 (H) 07/07/2023    Lab Results  Component Value Date   PSA 6.18 (H) 08/23/2021    No results found for: TESTOSTERONE  No results found for: HGBA1C  Urinalysis    Component Value Date/Time   COLORURINE YELLOW 07/07/2023 1923   APPEARANCEUR HAZY (A) 07/07/2023 1923   APPEARANCEUR Cloudy (A) 06/28/2023 0816   LABSPEC 1.024 07/07/2023 1923   PHURINE 5.0 07/07/2023 1923   GLUCOSEU NEGATIVE 07/07/2023 1923   HGBUR NEGATIVE 07/07/2023 1923   BILIRUBINUR NEGATIVE 07/07/2023 1923   BILIRUBINUR Negative 06/28/2023 0816   KETONESUR NEGATIVE 07/07/2023 1923   PROTEINUR NEGATIVE 07/07/2023 1923   NITRITE NEGATIVE 07/07/2023 1923   LEUKOCYTESUR NEGATIVE 07/07/2023 1923    Lab Results  Component Value Date   LABMICR See below: 06/28/2023   WBCUA >30 (A) 06/28/2023   LABEPIT 0-10 06/28/2023   BACTERIA RARE (A) 06/30/2023    Pertinent Imaging: Renal US  07/29/2023: Images reviewed and discussed with the patient  No results found for this or any previous visit.  No results found for this or any previous visit.  No results found for this or any previous visit.  No results found for this or any previous visit.  Results for orders placed during the hospital encounter of 07/29/23  Ultrasound renal complete  Narrative CLINICAL DATA:  Nephrolithiasis  EXAM: RENAL / URINARY TRACT ULTRASOUND COMPLETE  COMPARISON:  CT 06/30/2023  FINDINGS: Right Kidney:  Renal measurements: 13.7 x 5.9 x 7.1 cm = volume: 298.8 mL. Echogenicity within normal limits. No mass or hydronephrosis visualized.  Left Kidney:  Renal measurements: 13.0 x 7.3 x 6.4 cm = volume: 313.3  mL. Echogenicity within normal limits. No mass or hydronephrosis visualized.  Bladder:  Underdistended urinary bladder. Ureteral jets are seen. Slight wall thickening of the urinary bladder.  Other:  None.  IMPRESSION: No collecting system dilatation. Slight wall thickening of the urinary bladder.   Electronically Signed By: Ranell Bring M.D. On: 08/02/2023 11:32  No results found for this or any previous visit.  No results found for this or any previous visit.  No results found for this or any previous visit.   Assessment & Plan:    1. Calculus of bladder (Primary) -resolved - Urinalysis, Routine w reflex microscopic  2. BPH with nocturia -uroxatral 10mg  daily   No follow-ups on file.  Belvie Clara, MD  [facility] [place]

## 2023-08-12 NOTE — Patient Instructions (Signed)

## 2023-08-13 ENCOUNTER — Encounter: Payer: Self-pay | Admitting: Medical

## 2023-09-05 ENCOUNTER — Encounter: Payer: 59 | Admitting: Medical

## 2023-11-06 ENCOUNTER — Encounter: Payer: Self-pay | Admitting: Medical

## 2023-11-06 ENCOUNTER — Ambulatory Visit (INDEPENDENT_AMBULATORY_CARE_PROVIDER_SITE_OTHER): Admitting: Medical

## 2023-11-06 VITALS — BP 140/84 | HR 63 | Temp 98.0°F | Resp 14 | Ht 72.0 in | Wt 257.2 lb

## 2023-11-06 DIAGNOSIS — E785 Hyperlipidemia, unspecified: Secondary | ICD-10-CM

## 2023-11-06 DIAGNOSIS — R972 Elevated prostate specific antigen [PSA]: Secondary | ICD-10-CM | POA: Diagnosis not present

## 2023-11-06 DIAGNOSIS — F419 Anxiety disorder, unspecified: Secondary | ICD-10-CM | POA: Diagnosis not present

## 2023-11-06 DIAGNOSIS — K219 Gastro-esophageal reflux disease without esophagitis: Secondary | ICD-10-CM

## 2023-11-06 DIAGNOSIS — D649 Anemia, unspecified: Secondary | ICD-10-CM | POA: Diagnosis not present

## 2023-11-06 DIAGNOSIS — R030 Elevated blood-pressure reading, without diagnosis of hypertension: Secondary | ICD-10-CM

## 2023-11-06 NOTE — Progress Notes (Signed)
 Subjective:    Patient ID: George Love, male    DOB: 18-Sep-1955, 68 y.o.   MRN: [id number]  HPI   George Love is a 68 year old male who presents for follow-up after recent urological interventions and infections.  He has a history of elevated PSA, with the most recent level at 9.5. He has undergone multiple prostate procedures, including four biopsies, all negative for malignancy. During a recent procedure, a large bladder stone was discovered and removed. Following this, he experienced multiple infections requiring emergency room visits and treatment with three different antibiotics, the last of which was completed in May 2025. He feels better now and has resumed his exercise routine.  He is currently taking famotidine daily for heartburn, which was switched from omeprazole by his GI doctor. He also takes Lexapro  for anxiety, which he describes as being 'a bit fidgety and anxious.'  He quit smoking on Jun 19, 2013, after smoking for approximately 20-25 years at a rate of about a pack a day. He hArecent lab results indicate slight anemia.  He is taking an over-the-counter supplement, which he purchases from [place] that states purpose is to increase his testosterone. He has not experienced any symptoms of blood clots.  His blood pressure readings have been variable, with recent measurements showing slight elevation. He has a home blood pressure monitor and plans to track his readings more consistently. He follows a low-salt diet and has recently started working with a trainer to improve his fitness.  He typically receives his flu vaccine in early October and plans to do so again this year. He gets his vaccines at [facility], which is also his prescription location.      Review of Systems  Constitutional:  Negative for chills, fatigue and fever.  HENT:  Negative for dental problem and ear pain.   Respiratory:  Negative for cough, chest tightness and wheezing.   Cardiovascular:   Negative for chest pain and palpitations.  Gastrointestinal:  Negative for abdominal pain, blood in stool and constipation.  Genitourinary:  Negative for dysuria, frequency and hematuria.  Musculoskeletal:  Negative for back pain, joint swelling and neck pain.  Skin:  Negative for rash.  Neurological:  Negative for dizziness, seizures, weakness and headaches.  Hematological:  Negative for adenopathy. Does not bruise/bleed easily.  Psychiatric/Behavioral:  Negative for behavioral problems and decreased concentration. The patient is not nervous/anxious.     Past Medical History:  Diagnosis Date   Anxiety    Bladder stone    GERD (gastroesophageal reflux disease)    Hyperlipidemia      Social History   Socioeconomic History   Marital status: Married    Spouse name: Not on file   Number of children: Not on file   Years of education: Not on file   Highest education level: Bachelor's degree (e.g., BA, AB, BS)  Occupational History   Not on file  Tobacco Use   Smoking status: Former    Current packs/day: 0.00    Average packs/day: 1 pack/day for 20.0 years (20.0 ttl pk-yrs)    Types: Cigarettes    Start date: 10/27/1993    Quit date: 10/27/2013    Years since quitting: 10.0   Smokeless tobacco: Never  Vaping Use   Vaping status: Never Used  Substance and Sexual Activity   Alcohol use: Yes    Comment: socially. 3-4 spready out over a week.   Drug use: Never   Sexual activity: Yes  Other Topics  Concern   Not on file  Social History Narrative   Not on file   Social Drivers of Health   Financial Resource Strain: Low Risk  (10/30/2023)   Overall Financial Resource Strain (CARDIA)    Difficulty of Paying Living Expenses: Not hard at all  Food Insecurity: No Food Insecurity (10/30/2023)   Hunger Vital Sign    Worried About Running Out of Food in the Last Year: Never true    Ran Out of Food in the Last Year: Never true  Transportation Needs: No Transportation Needs (10/30/2023)    PRAPARE - Administrator, Civil Service (Medical): No    Lack of Transportation (Non-Medical): No  Physical Activity: Sufficiently Active (10/30/2023)   Exercise Vital Sign    Days of Exercise per Week: 5 days    Minutes of Exercise per Session: 60 min  Stress: No Stress Concern Present (10/30/2023)   [employer] of Occupational Health - Occupational Stress Questionnaire    Feeling of Stress: Only a little  Social Connections: Socially Integrated (10/30/2023)   Social Connection and Isolation Panel    Frequency of Communication with Friends and Family: More than three times a week    Frequency of Social Gatherings with Friends and Family: Twice a week    Attends Religious Services: 1 to 4 times per year    Active Member of [employer] or Organizations: Yes    Attends Banker Meetings: 1 to 4 times per year    Marital Status: Married  Catering manager Violence: Not on file    Past Surgical History:  Procedure Laterality Date   CYSTOSCOPY WITH LITHOLAPAXY N/A 05/30/2023   Procedure: CYSTOSCOPY, WITH BLADDER CALCULUS LITHOLAPAXY;  Surgeon: Sherrilee Belvie CROME, MD;  Location: [facility];  Service: Urology;  Laterality: N/A;   ESOPHAGOGASTRODUODENOSCOPY (EGD) WITH PROPOFOL  N/A 08/14/2022   Procedure: ESOPHAGOGASTRODUODENOSCOPY (EGD) WITH PROPOFOL ;  Surgeon: Rosalie Kitchens, MD;  Location: [facility];  Service: Gastroenterology;  Laterality: N/A;   ESOPHAGOGASTRODUODENOSCOPY (EGD) WITH PROPOFOL  N/A 04/09/2023   Procedure: ESOPHAGOGASTRODUODENOSCOPY (EGD) WITH PROPOFOL ;  Surgeon: Rosalie Kitchens, MD;  Location: [facility];  Service: Gastroenterology;  Laterality: N/A;   HOLMIUM LASER APPLICATION N/A 05/30/2023   Procedure: HOLMIUM LASER APPLICATION;  Surgeon: Sherrilee Belvie CROME, MD;  Location: [facility];  Service: Urology;  Laterality: N/A;   KNEE ARTHROSCOPY Right    POLYPECTOMY  08/14/2022   Procedure: POLYPECTOMY;  Surgeon: Rosalie Kitchens, MD;  Location: [facility];  Service:  Gastroenterology;;   POLYPECTOMY  04/09/2023   Procedure: POLYPECTOMY;  Surgeon: Rosalie Kitchens, MD;  Location: [facility];  Service: Gastroenterology;;   PROSTATE SURGERY     x4    No family history on file.  No Known Allergies  Current Outpatient Medications on File Prior to Visit  Medication Sig Dispense Refill   alfuzosin  (UROXATRAL ) 10 MG 24 hr tablet Take 1 tablet (10 mg total) by mouth at bedtime. 30 tablet 11   Ascorbic Acid (VITAMIN C) 1000 MG tablet Take 1,000 mg by mouth daily.     cholecalciferol (VITAMIN D3) 25 MCG (1000 UNIT) tablet Take 1,000 Units by mouth daily.     escitalopram  (LEXAPRO ) 10 MG tablet Take 1 tablet (10 mg total) by mouth daily. 90 tablet 3   Menatetrenone (VITAMIN K2) 100 MCG TABS Take 100 mcg by mouth daily.     Multiple Vitamin (MULTIVITAMIN) tablet Take 1 tablet by mouth daily.     omeprazole (PRILOSEC OTC) 20 MG tablet Take 20 mg by  mouth daily.     Red Yeast Rice 600 MG CAPS Take 1,200 mg by mouth daily.     zinc sulfate 220 (50 Zn) MG capsule Take 220 mg by mouth daily.     Magnesium 100 MG TABS Take 100 mg by mouth 1 day or 1 dose.     No current facility-administered medications on file prior to visit.    BP (!) 140/84 Comment: 150/80  Pulse 63   Temp 98 F (36.7 C) (Oral)   Resp 14   Ht 6' (1.829 m)   Wt 257 lb 3.2 oz (116.7 kg)   SpO2 94%   BMI 34.88 kg/m        Objective:   Physical Exam  General Mental Status- Alert. General Appearance- Not in acute distress.   Skin General: Color- Normal Color. Moisture- Normal Moisture.  Neck Carotid Arteries- Normal color. Moisture- Normal Moisture. No carotid bruits. No JVD.  Chest and Lung Exam Auscultation: Breath Sounds:-CTA  Cardiovascular Auscultation:Rythm- RRR Murmurs & Other Heart Sounds:Auscultation of the heart reveals- No Murmurs.  Abdomen Inspection:-Inspeection Normal. Palpation/Percussion:Note:No mass. Palpation and Percussion of the abdomen reveal- Non  Tender, Non Distended + BS, no rebound or guarding.  Neurologic Cranial Nerve exam:- CN III-XII intact(No nystagmus), symmetric smile. Strength:- 5/5 equal and symmetric strength both upper and lower extremities.       Assessment & Plan:   Patient Instructions  Elevated PSA, under urology follow-up Previously elevated PSA at 9.5, possibly related to bladder stone. Multiple prostate biopsies performed. - Perform PSA test during urology follow-up appointment. -Bladder stone, status post removal with resolved urinary tract infection Status post bladder stone removal with resolved infections. He reports feeling better and resumed regular activities. - Continue follow-up with urology on December 18, 2023.    Elevated bp without htn.levated readings Blood pressure elevated at 150/80 mmHg. Variable home readings reported. Discussed lifestyle factors. - Monitor blood pressure at home daily for one week, ensuring proper technique. - Submit home blood pressure readings via patient portal. - Consider low-dose antihypertensive medication if readings remain elevated. - Encourage low-salt diet.  Mild anemia Mild anemia with hemoglobin and hematocrit slightly below normal. - Order CBC to monitor hemoglobin levels. - Order iron panel to assess for iron deficiency.  Indirect otc Testosterone supplement use Discussed potential side effects of testosterone supplement including polycythemia and blood clots. Current labs show mild anemia. - Monitor hemoglobin and hematocrit levels. - Discuss discontinuation of supplement if adverse effects occur.  Gastroesophageal reflux disease on famotidine GERD managed with daily famotidine as prescribed by GI specialist. Switched from omeprazole due to potential long-term side effects of PPIs.  Anxiety, on escitalopram  Anxiety managed with escitalopram . No current symptoms of depression reported.  Lung cancer screening CT lung cancer screening ordered but  delayed due to insurance transition. Recommended scheduling at [facility] to schedule CT lung cancer screening within the next week.  -Flu vaccine thru pharmacy  Follow up date to be determined after lab review    Dallas Maxwell, PA-C

## 2023-11-06 NOTE — Patient Instructions (Addendum)
 Elevated PSA, under urology follow-up Previously elevated PSA at 9.5, possibly related to bladder stone. Multiple prostate biopsies performed. - Perform PSA test during urology follow-up appointment. -Bladder stone, status post removal with resolved urinary tract infection Status post bladder stone removal with resolved infections. He reports feeling better and resumed regular activities. - Continue follow-up with urology on December 18, 2023.    Elevated bp without htn.levated readings Blood pressure elevated at 150/80 mmHg. Variable home readings reported. Discussed lifestyle factors. - Monitor blood pressure at home daily for one week, ensuring proper technique. - Submit home blood pressure readings via patient portal. - Consider low-dose antihypertensive medication if readings remain elevated. - Encourage low-salt diet.  Mild anemia Mild anemia with hemoglobin and hematocrit slightly below normal. - Order CBC to monitor hemoglobin levels. - Order iron panel to assess for iron deficiency.  Indirect otc Testosterone supplement use Discussed potential side effects of testosterone supplement including polycythemia and blood clots. Current labs show mild anemia. - Monitor hemoglobin and hematocrit levels. - Discuss discontinuation of supplement if adverse effects occur.  Gastroesophageal reflux disease on famotidine GERD managed with daily famotidine as prescribed by GI specialist. Switched from omeprazole due to potential long-term side effects of PPIs.  Anxiety, on escitalopram  Anxiety managed with escitalopram . No current symptoms of depression reported.  Lung cancer screening CT lung cancer screening ordered but delayed due to insurance transition. Recommended scheduling at [facility] to schedule CT lung cancer screening within the next week.  -Flu vaccine thru pharmacy  Follow up date to be determined after lab review

## 2023-11-07 ENCOUNTER — Ambulatory Visit: Payer: Self-pay | Admitting: Medical

## 2023-11-07 LAB — CBC WITH DIFFERENTIAL/PLATELET
Basophils Absolute: 0 K/uL (ref 0.0–0.1)
Basophils Relative: 0.5 % (ref 0.0–3.0)
Eosinophils Absolute: 0.1 K/uL (ref 0.0–0.7)
Eosinophils Relative: 1.2 % (ref 0.0–5.0)
HCT: 39.7 % (ref 39.0–52.0)
Hemoglobin: 13.2 g/dL (ref 13.0–17.0)
Lymphocytes Relative: 19.8 % (ref 12.0–46.0)
Lymphs Abs: 1.4 K/uL (ref 0.7–4.0)
MCHC: 33.2 g/dL (ref 30.0–36.0)
MCV: 89 fl (ref 78.0–100.0)
Monocytes Absolute: 0.4 K/uL (ref 0.1–1.0)
Monocytes Relative: 6 % (ref 3.0–12.0)
Neutro Abs: 5.3 K/uL (ref 1.4–7.7)
Neutrophils Relative %: 72.5 % (ref 43.0–77.0)
Platelets: 226 K/uL (ref 150.0–400.0)
RBC: 4.46 Mil/uL (ref 4.22–5.81)
RDW: 13.1 % (ref 11.5–15.5)
WBC: 7.3 K/uL (ref 4.0–10.5)

## 2023-11-07 LAB — IRON,TIBC AND FERRITIN PANEL
%SAT: 26 % (ref 20–48)
Ferritin: 43 ng/mL (ref 24–380)
Iron: 96 ug/dL (ref 50–180)
TIBC: 366 ug/dL (ref 250–425)

## 2023-11-07 LAB — COMPLETE METABOLIC PANEL WITHOUT GFR
AG Ratio: 2.3 (calc) (ref 1.0–2.5)
ALT: 23 U/L (ref 9–46)
AST: 25 U/L (ref 10–35)
Albumin: 4.6 g/dL (ref 3.6–5.1)
Alkaline phosphatase (APISO): 68 U/L (ref 35–144)
BUN: 20 mg/dL (ref 7–25)
CO2: 26 mmol/L (ref 20–32)
Calcium: 9.6 mg/dL (ref 8.6–10.3)
Chloride: 103 mmol/L (ref 98–110)
Creat: 0.79 mg/dL (ref 0.70–1.35)
Globulin: 2 g/dL (ref 1.9–3.7)
Glucose, Bld: 86 mg/dL (ref 65–99)
Potassium: 4.3 mmol/L (ref 3.5–5.3)
Sodium: 140 mmol/L (ref 135–146)
Total Bilirubin: 0.6 mg/dL (ref 0.2–1.2)
Total Protein: 6.6 g/dL (ref 6.1–8.1)

## 2023-11-07 LAB — LIPID PANEL
Cholesterol: 170 mg/dL (ref 0–200)
HDL: 41.7 mg/dL (ref >39.00)
LDL Cholesterol: 97 mg/dL (ref 0–99)
NonHDL: 128.36
Total CHOL/HDL Ratio: 4
Triglycerides: 155 mg/dL — ABNORMAL HIGH (ref 0.0–149.0)
VLDL: 31 mg/dL (ref 0.0–40.0)

## 2023-11-08 NOTE — Addendum Note (Signed)
 Addended by: DORINA DALLAS HERO on: 11/08/2023 01:42 PM   Modules accepted: Orders

## 2023-11-13 ENCOUNTER — Ambulatory Visit (HOSPITAL_COMMUNITY)
Admission: RE | Admit: 2023-11-13 | Discharge: 2023-11-13 | Disposition: A | Discharge status: Home / Self Care | Source: Ambulatory Visit | Attending: Acute Care | Admitting: Acute Care

## 2023-11-13 DIAGNOSIS — Z87891 Personal history of nicotine dependence: Secondary | ICD-10-CM | POA: Insufficient documentation

## 2023-11-13 DIAGNOSIS — Z122 Encounter for screening for malignant neoplasm of respiratory organs: Secondary | ICD-10-CM | POA: Insufficient documentation

## 2023-11-20 ENCOUNTER — Other Ambulatory Visit: Payer: Self-pay

## 2023-11-20 DIAGNOSIS — Z122 Encounter for screening for malignant neoplasm of respiratory organs: Secondary | ICD-10-CM

## 2023-11-20 DIAGNOSIS — Z87891 Personal history of nicotine dependence: Secondary | ICD-10-CM

## 2023-12-18 ENCOUNTER — Ambulatory Visit (INDEPENDENT_AMBULATORY_CARE_PROVIDER_SITE_OTHER): Admitting: Urology

## 2023-12-18 ENCOUNTER — Encounter: Payer: Self-pay | Admitting: Urology

## 2023-12-18 VITALS — BP 136/65 | HR 67

## 2023-12-18 DIAGNOSIS — N138 Other obstructive and reflux uropathy: Secondary | ICD-10-CM | POA: Diagnosis not present

## 2023-12-18 DIAGNOSIS — N200 Calculus of kidney: Secondary | ICD-10-CM

## 2023-12-18 DIAGNOSIS — N210 Calculus in bladder: Secondary | ICD-10-CM

## 2023-12-18 DIAGNOSIS — R972 Elevated prostate specific antigen [PSA]: Secondary | ICD-10-CM

## 2023-12-18 DIAGNOSIS — R3915 Urgency of urination: Secondary | ICD-10-CM

## 2023-12-18 DIAGNOSIS — N401 Enlarged prostate with lower urinary tract symptoms: Secondary | ICD-10-CM | POA: Diagnosis not present

## 2023-12-18 LAB — URINALYSIS, ROUTINE W REFLEX MICROSCOPIC
Bilirubin, UA: NEGATIVE
Glucose, UA: NEGATIVE
Ketones, UA: NEGATIVE
Leukocytes,UA: NEGATIVE
Nitrite, UA: NEGATIVE
Protein,UA: NEGATIVE
RBC, UA: NEGATIVE
Specific Gravity, UA: 1.025 (ref 1.005–1.030)
Urobilinogen, Ur: 0.2 mg/dL (ref 0.2–1.0)
pH, UA: 5.5 (ref 5.0–7.5)

## 2023-12-18 MED ORDER — ALFUZOSIN HCL ER 10 MG PO TB24: 10.0000 mg | ORAL_TABLET | Freq: Every day | ORAL | 3 refills | Status: AC

## 2023-12-18 NOTE — Progress Notes (Signed)
 12/18/2023 9:28 AM   George Love 27-Jun-1955 [id number]  Referring provider: Dorina Loving, PA-C [id number] [place],  [place] [address]  Followup BPH   HPI: [name] is a 68yo here for followup for BPH. Last visit we started uroxatral  10mg  which significantly improved his LUTS. IPPS 8 QOL 1. Nocturia 2x. Uirne stream strong. No straining to urinate. He has bothersome urinary urgency. No other complaints today    PMH: Past Medical History:  Diagnosis Date   Anxiety    Bladder stone    GERD (gastroesophageal reflux disease)    Hyperlipidemia     Surgical History: Past Surgical History:  Procedure Laterality Date   CYSTOSCOPY WITH LITHOLAPAXY N/A 05/30/2023   Procedure: CYSTOSCOPY, WITH BLADDER CALCULUS LITHOLAPAXY;  Surgeon: Sherrilee Belvie CROME, MD;  Location: [facility];  Service: Urology;  Laterality: N/A;   ESOPHAGOGASTRODUODENOSCOPY (EGD) WITH PROPOFOL  N/A 08/14/2022   Procedure: ESOPHAGOGASTRODUODENOSCOPY (EGD) WITH PROPOFOL ;  Surgeon: Rosalie Kitchens, MD;  [facility];  Service: Gastroenterology;  Laterality: N/A;   ESOPHAGOGASTRODUODENOSCOPY (EGD) WITH PROPOFOL  N/A 04/09/2023   Procedure: ESOPHAGOGASTRODUODENOSCOPY (EGD) WITH PROPOFOL ;  Surgeon: Rosalie Kitchens, MD;  [facility];  Service: Gastroenterology;  Laterality: N/A;   HOLMIUM LASER APPLICATION N/A 05/30/2023   Procedure: HOLMIUM LASER APPLICATION;  Surgeon: Sherrilee Belvie CROME, MD;  Location: [facility];  Service: Urology;  Laterality: N/A;   KNEE ARTHROSCOPY Right    POLYPECTOMY  08/14/2022   Procedure: POLYPECTOMY;  Surgeon: Rosalie Kitchens, MD;  [facility]: [facility];  Service: Gastroenterology;;   POLYPECTOMY  04/09/2023   Procedure: POLYPECTOMY;  Surgeon: Rosalie Kitchens, MD;  [facility]: [facility];  Service: Gastroenterology;;   PROSTATE SURGERY     x4    Home Medications:  Allergies as of 12/18/2023   No Known Allergies      Medication List        Accurate as of December 18, 2023  9:28 AM. If you have any questions, ask your nurse or doctor.          alfuzosin  10 MG 24 hr tablet Commonly known as: UROXATRAL  Take 1 tablet (10 mg total) by mouth at bedtime.   cholecalciferol 25 MCG (1000 UNIT) tablet Commonly known as: VITAMIN D3 Take 1,000 Units by mouth daily.   escitalopram  10 MG tablet Commonly known as: Lexapro  Take 1 tablet (10 mg total) by mouth daily.   Magnesium 100 MG Tabs Take 100 mg by mouth 1 day or 1 dose.   multivitamin tablet Take 1 tablet by mouth daily.   omeprazole 20 MG tablet Commonly known as: PRILOSEC OTC Take 20 mg by mouth daily.   Red Yeast Rice 600 MG Caps Take 1,200 mg by mouth daily.   vitamin C 1000 MG tablet Take 1,000 mg by mouth daily.   Vitamin K2 100 MCG Tabs Take 100 mcg by mouth daily.   zinc sulfate (50mg  elemental zinc) 220 (50 Zn) MG capsule Take 220 mg by mouth daily.        Allergies: No Known Allergies  Family History: No family history on file.  Social History:  reports that he quit smoking about 10 years ago. His smoking use included cigarettes. He started smoking about 30 years ago. He has a 20 pack-year smoking history. He has never used smokeless tobacco. He reports current alcohol use. He reports that he does not use drugs.  ROS: All other review of systems were reviewed and are negative except what is noted above  in HPI  Physical Exam: BP 136/65   Pulse 67   Constitutional:  Alert and oriented, No acute distress. HEENT: [place] AT, moist mucus membranes.  Trachea midline, no masses. Cardiovascular: No clubbing, cyanosis, or edema. Respiratory: Normal respiratory effort, no increased work of breathing. GI: Abdomen is soft, nontender, nondistended, no abdominal masses GU: No CVA tenderness.  Lymph: No cervical or inguinal lymphadenopathy. Skin: No rashes, bruises or suspicious lesions. Neurologic: Grossly intact, no focal deficits, moving all 4 extremities. Psychiatric: Normal  mood and affect.  Laboratory Data: Lab Results  Component Value Date   WBC 7.3 11/06/2023   HGB 13.2 11/06/2023   HCT 39.7 11/06/2023   MCV 89.0 11/06/2023   PLT 226.0 11/06/2023    Lab Results  Component Value Date   CREATININE 0.79 11/06/2023    Lab Results  Component Value Date   PSA 6.18 (H) 08/23/2021    No results found for: TESTOSTERONE  No results found for: HGBA1C  Urinalysis    Component Value Date/Time   COLORURINE YELLOW 07/07/2023 1923   APPEARANCEUR Clear 08/12/2023 1150   LABSPEC 1.024 07/07/2023 1923   PHURINE 5.0 07/07/2023 1923   GLUCOSEU Negative 08/12/2023 1150   HGBUR NEGATIVE 07/07/2023 1923   BILIRUBINUR Negative 08/12/2023 1150   KETONESUR NEGATIVE 07/07/2023 1923   PROTEINUR Negative 08/12/2023 1150   PROTEINUR NEGATIVE 07/07/2023 1923   NITRITE Negative 08/12/2023 1150   NITRITE NEGATIVE 07/07/2023 1923   LEUKOCYTESUR Negative 08/12/2023 1150   LEUKOCYTESUR NEGATIVE 07/07/2023 1923    Lab Results  Component Value Date   LABMICR Comment 08/12/2023   WBCUA >30 (A) 06/28/2023   LABEPIT 0-10 06/28/2023   BACTERIA RARE (A) 06/30/2023    Pertinent Imaging:  No results found for this or any previous visit.  No results found for this or any previous visit.  No results found for this or any previous visit.  No results found for this or any previous visit.  Results for orders placed during the hospital encounter of 07/29/23  Ultrasound renal complete  Narrative CLINICAL DATA:  Nephrolithiasis  EXAM: RENAL / URINARY TRACT ULTRASOUND COMPLETE  COMPARISON:  CT 06/30/2023  FINDINGS: Right Kidney:  Renal measurements: 13.7 x 5.9 x 7.1 cm = volume: 298.8 mL. Echogenicity within normal limits. No mass or hydronephrosis visualized.  Left Kidney:  Renal measurements: 13.0 x 7.3 x 6.4 cm = volume: 313.3 mL. Echogenicity within normal limits. No mass or hydronephrosis visualized.  Bladder:  Underdistended urinary  bladder. Ureteral jets are seen. Slight wall thickening of the urinary bladder.  Other:  None.  IMPRESSION: No collecting system dilatation. Slight wall thickening of the urinary bladder.   Electronically Signed By: Ranell Bring M.D. On: 08/02/2023 11:32  No results found for this or any previous visit.  No results found for this or any previous visit.  No results found for this or any previous visit.   Assessment & Plan:    1. Benign prostatic hyperplasia with urinary obstruction (Primary) Continue uroxatral  10mg  qhs - Urinalysis, Routine w reflex microscopic  2. Urinary urgency Continue uroxatral  10mg  qhs   No follow-ups on file.  Belvie Clara, MD  [facility] [place]

## 2023-12-18 NOTE — Patient Instructions (Signed)

## 2023-12-26 ENCOUNTER — Telehealth: Payer: Self-pay | Admitting: Medical

## 2023-12-26 NOTE — Telephone Encounter (Signed)
 Copied from CRM [id number]. Topic: Medicare AWV >> Dec 26, 2023  1:16 PM Nathanel DEL wrote: Reason for CRM: Called LVM 12/26/2023 to schedule AWV. Please schedule in office only DUE TO [employer]  Nathanel Paschal; Care Guide Ambulatory Clinical Support [facility] Direct Dial: [phone]

## 2024-01-30 ENCOUNTER — Other Ambulatory Visit: Payer: Self-pay | Admitting: Medical

## 2024-02-03 ENCOUNTER — Other Ambulatory Visit

## 2024-02-03 DIAGNOSIS — E785 Hyperlipidemia, unspecified: Secondary | ICD-10-CM

## 2024-02-03 LAB — LIPID PANEL
Cholesterol: 154 mg/dL (ref 0–200)
HDL: 45.5 mg/dL (ref >39.00)
LDL Cholesterol: 75 mg/dL (ref 0–99)
NonHDL: 108.87
Total CHOL/HDL Ratio: 3
Triglycerides: 168 mg/dL — ABNORMAL HIGH (ref 0.0–149.0)
VLDL: 33.6 mg/dL (ref 0.0–40.0)

## 2024-02-04 NOTE — Progress Notes (Signed)
 Last read by Manus JAYSON Rolinda Gerlene at 8:08AM on 02/04/2024.

## 2024-02-06 ENCOUNTER — Ambulatory Visit: Admitting: Medical

## 2024-02-06 ENCOUNTER — Encounter: Payer: Self-pay | Admitting: Medical

## 2024-02-06 ENCOUNTER — Ambulatory Visit: Payer: Self-pay | Admitting: Medical

## 2024-02-06 VITALS — BP 138/70 | HR 72 | Temp 98.7°F | Resp 15 | Ht 72.0 in | Wt 252.2 lb

## 2024-02-06 DIAGNOSIS — R7989 Other specified abnormal findings of blood chemistry: Secondary | ICD-10-CM | POA: Diagnosis not present

## 2024-02-06 DIAGNOSIS — F419 Anxiety disorder, unspecified: Secondary | ICD-10-CM

## 2024-02-06 DIAGNOSIS — R972 Elevated prostate specific antigen [PSA]: Secondary | ICD-10-CM

## 2024-02-06 DIAGNOSIS — E785 Hyperlipidemia, unspecified: Secondary | ICD-10-CM

## 2024-02-06 DIAGNOSIS — I10 Essential (primary) hypertension: Secondary | ICD-10-CM | POA: Diagnosis not present

## 2024-02-06 LAB — CBC WITH DIFFERENTIAL/PLATELET
Basophils Absolute: 0 K/uL (ref 0.0–0.1)
Basophils Relative: 0.7 % (ref 0.0–3.0)
Eosinophils Absolute: 0.1 K/uL (ref 0.0–0.7)
Eosinophils Relative: 2 % (ref 0.0–5.0)
HCT: 40.3 % (ref 39.0–52.0)
Hemoglobin: 13.4 g/dL (ref 13.0–17.0)
Lymphocytes Relative: 17.7 % (ref 12.0–46.0)
Lymphs Abs: 1.3 K/uL (ref 0.7–4.0)
MCHC: 33.1 g/dL (ref 30.0–36.0)
MCV: 91 fl (ref 78.0–100.0)
Monocytes Absolute: 0.5 K/uL (ref 0.1–1.0)
Monocytes Relative: 6.8 % (ref 3.0–12.0)
Neutro Abs: 5.2 K/uL (ref 1.4–7.7)
Neutrophils Relative %: 72.8 % (ref 43.0–77.0)
Platelets: 236 K/uL (ref 150.0–400.0)
RBC: 4.43 Mil/uL (ref 4.22–5.81)
RDW: 13.4 % (ref 11.5–15.5)
WBC: 7.1 K/uL (ref 4.0–10.5)

## 2024-02-06 MED ORDER — LOSARTAN POTASSIUM 25 MG PO TABS: 25.0000 mg | ORAL_TABLET | Freq: Every day | ORAL | 3 refills | Status: AC

## 2024-02-06 MED ORDER — ATORVASTATIN CALCIUM 10 MG PO TABS: ORAL_TABLET | ORAL | 11 refills | Status: AC

## 2024-02-06 NOTE — Patient Instructions (Signed)
 Anxiety. Chronic anxiety managed with Lexapro . Concerns about long-term use and discontinuation syndrome. Prefers tapering in summer due to stressors. - Consider tapering Lexapro  by summer, starting with 10 mg every other day for two months, then halving if stable.  Essential hypertension Stage 1 hypertension with average BP 135/75 mmHg. Discussed lifestyle modifications and medication to achieve target BP <130/80 mmHg. Losartan  considered to lower BP and reduce cardiovascular risk. - Start losartan  25 mg daily. - Monitor BP at home, report in two weeks. - Consider atorvastatin  after one week of losartan  if BP stable.  Hyperlipidemia LDL 101 mg/dL, elevated triglycerides, cardiovascular risk score 14.9%. Atorvastatin  considered for better cholesterol control. Discussed side effects and dosing schedule. - Start atorvastatin  10 mg on Monday, Wednesday, and Friday after one week of losartan . - Discontinue red yeast rice when starting atorvastatin .  Elevated prostate specific antigen (PSA) PSA elevated at 9.5 ng/mL, attributed to bladder stone. Follow-up with urologist scheduled. Discussed testosterone supplementation impact on PSA and cardiovascular risk. - Continue follow-up with urologist in early 2026 for PSA evaluation.  Abnormal complete blood count Concerns about testosterone supplementation affecting blood count. Recent CBC not performed. - Ordered CBC to assess current blood count.   Follow up 3 month or sooner if needed.(update me on blood pressure readings in 2 weeks)

## 2024-02-06 NOTE — Progress Notes (Unsigned)
 Subjective:    Patient ID: George Love, male    DOB: Apr 25, 1955, 68 y.o.   MRN: [id number]  HPI  George Love is a 68 year old male who presents for medication management and blood pressure monitoring.  He has chronic low-level anxiety for over ten years with jitteriness, feeling high-strung, and irritability. He takes Lexapro , which controls symptoms. Prior taper attempts caused increased jitteriness. He is concerned about long-term Lexapro  use but feels it is beneficial.  His home blood pressure readings are typically 135/75 to 138/70. He has never used antihypertensive medication and prefers lifestyle measures. He works with a psychologist, educational, stretches regularly, and notes improved flexibility and mobility. Weight is 252 lb with a goal of about 210 lb, and he has lost less weight than expected.  He takes red yeast rice daily for cholesterol. Recent triglycerides and LDL were both 111 mg/dL. He is interested in alternatives because of concern about statin side effects.  He has had elevated PSA in the past attributed to a large bladder stone that was removed. Multiple prior prostate biopsies have been negative. He plans a follow-up PSA test early next year.  He takes a testosterone supplement, which he feels has improved body composition. He is aware of possible risks including higher blood pressure and blood clots.  He currently has no back pain and feels healthier with better mobility. He is under significant stress from home, caregiving, and business issues, which he is managing alongside his anxiety.           The 10-year ASCVD risk score (Arnett DK, et al., 2019) is: 14.9%   Values used to calculate the score:     Age: 68 years     Clinically relevant sex: Male     Is Non-Hispanic African American: No     Diabetic: No     Tobacco smoker: No     Systolic Blood Pressure: 138 mmHg     Is BP treated: No     HDL Cholesterol: 45.5 mg/dL     Total Cholesterol: 154 mg/dL     Review of Systems         Objective:   Physical Exam        Assessment & Plan:   Patient Instructions  Anxiety. Chronic anxiety managed with Lexapro . Concerns about long-term use and discontinuation syndrome. Prefers tapering in summer due to stressors. - Consider tapering Lexapro  by summer, starting with 10 mg every other day for two months, then halving if stable.  Essential hypertension Stage 1 hypertension with average BP 135/75 mmHg. Discussed lifestyle modifications and medication to achieve target BP <130/80 mmHg. Losartan  considered to lower BP and reduce cardiovascular risk. - Start losartan  25 mg daily. - Monitor BP at home, report in two weeks. - Consider atorvastatin  after one week of losartan  if BP stable.  Hyperlipidemia LDL 101 mg/dL, elevated triglycerides, cardiovascular risk score 14.9%. Atorvastatin  considered for better cholesterol control. Discussed side effects and dosing schedule. - Start atorvastatin  10 mg on Monday, Wednesday, and Friday after one week of losartan . - Discontinue red yeast rice when starting atorvastatin .  Elevated prostate specific antigen (PSA) PSA elevated at 9.5 ng/mL, attributed to bladder stone. Follow-up with urologist scheduled. Discussed testosterone supplementation impact on PSA and cardiovascular risk. - Continue follow-up with urologist in early 2026 for PSA evaluation.  Abnormal complete blood count Concerns about testosterone supplementation affecting blood count. Recent CBC not performed. - Ordered CBC to assess current blood count.  Follow up 3 month or sooner if needed.(update me on blood pressure readings in 2 weeks)   Dallas Maxwell, PA-C

## 2024-03-02 ENCOUNTER — Encounter: Payer: Self-pay | Admitting: Urology

## 2024-03-13 ENCOUNTER — Encounter: Payer: Self-pay | Admitting: Medical

## 2024-04-13 ENCOUNTER — Inpatient Hospital Stay: Admitting: Medical

## 2024-06-03 ENCOUNTER — Ambulatory Visit (HOSPITAL_COMMUNITY)

## 2024-06-11 ENCOUNTER — Other Ambulatory Visit

## 2024-06-17 ENCOUNTER — Ambulatory Visit: Admitting: Urology

## 2024-11-06 ENCOUNTER — Encounter: Admitting: Medical
